# Patient Record
Sex: Female | Born: 1935 | Race: White | Hispanic: No | Marital: Single | Smoking: Never smoker
Health system: Southern US, Community
[De-identification: ages and names within clinical notes are randomized; demographics above are authoritative.]

## PROBLEM LIST (undated history)

## (undated) DIAGNOSIS — F0390 Unspecified dementia without behavioral disturbance: Secondary | ICD-10-CM

## (undated) DIAGNOSIS — F329 Major depressive disorder, single episode, unspecified: Secondary | ICD-10-CM

## (undated) DIAGNOSIS — I4891 Unspecified atrial fibrillation: Secondary | ICD-10-CM

## (undated) DIAGNOSIS — E119 Type 2 diabetes mellitus without complications: Secondary | ICD-10-CM

## (undated) DIAGNOSIS — K219 Gastro-esophageal reflux disease without esophagitis: Secondary | ICD-10-CM

## (undated) DIAGNOSIS — E785 Hyperlipidemia, unspecified: Secondary | ICD-10-CM

## (undated) DIAGNOSIS — F32A Depression, unspecified: Secondary | ICD-10-CM

---

## 2016-04-30 ENCOUNTER — Encounter: Payer: Self-pay | Admitting: Orthopaedic Surgery

## 2016-04-30 ENCOUNTER — Ambulatory Visit (INDEPENDENT_AMBULATORY_CARE_PROVIDER_SITE_OTHER): Payer: Medicare Other | Admitting: Orthopaedic Surgery

## 2016-04-30 VITALS — BP 114/76 | HR 82 | Temp 97.0°F

## 2016-04-30 DIAGNOSIS — R4181 Age-related cognitive decline: Secondary | ICD-10-CM | POA: Diagnosis not present

## 2016-04-30 DIAGNOSIS — M25561 Pain in right knee: Secondary | ICD-10-CM | POA: Diagnosis not present

## 2016-04-30 NOTE — Progress Notes (Signed)
Subjective:  Her right knee hurts.  Her daughter gave history as patient has senility.    Patient ID: Maureen Coleman, female    DOB: 07/06/1934, 82 y.o.   MRN: [id number]  Knee Pain  There was no injury mechanism. The pain is present in the right knee. The quality of the pain is described as aching. The pain is at a severity of 3/10. The pain is mild. The pain has been fluctuating since onset. Associated symptoms include a loss of motion. Pertinent negatives include no loss of sensation, muscle weakness, numbness or tingling. The symptoms are aggravated by weight bearing. She has tried ice, non-weight bearing and rest for the symptoms. The treatment provided mild relief.   The patient is a resident at [facility].  She is accompanied by her daughter who gives the history as the patient has senility.    The patient has a long history of right knee pain.  She has gotten worse over the last few months.  She has intermittent swelling of the knee, giving way and pain.  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  She has had ice and Aspercreme in the nursing home as well as Voltaren Gel with some help.  She is very allergic to prednisone and cannot take any in any form.  She has no trauma but she could have hit the knee.  Her daughter would like to get an injection in the knee for her mother but I told her the injection would have prednisone and I cannot do that.  She agrees.  I will order PT and ice and the Aspercreme for the patient.  She will be treated conservatively.  The PT should help.  I will see her back in two weeks and re-assess.   Review of Systems  HENT: Negative for congestion.   Respiratory: Negative for cough and shortness of breath.   Cardiovascular: Negative for chest pain and leg swelling.  Endocrine: Positive for cold intolerance.  Musculoskeletal: Positive for joint swelling, arthralgias and gait problem.   Allergic/Immunologic: Positive for environmental allergies.  Neurological: Negative for tingling and numbness.  Psychiatric/Behavioral: The patient is nervous/anxious.        Objective:   Physical Exam  Constitutional: She is oriented to person, place, and time. She appears well-developed and well-nourished.  HENT:  Head: Normocephalic and atraumatic.  Eyes: Conjunctivae and EOM are normal. Pupils are equal, round, and reactive to light.  Neck: Normal range of motion. Neck supple.  Cardiovascular: Normal rate, regular rhythm and intact distal pulses.   Pulmonary/Chest: Effort normal.  Abdominal: Soft.  Musculoskeletal: She exhibits tenderness (Right knee with boggy synovium, minimal effusion, ROM 0 to 90 with pain, crepitus, NV intact..  Knee stable.  Left knee with anterior scar from total knee.).  Neurological: She is alert and oriented to person, place, and time. She displays normal reflexes. No cranial nerve deficit. She exhibits normal muscle tone. Coordination normal.  Skin: Skin is warm and dry.  Psychiatric: She has a normal mood and affect. Her behavior is normal.  Patient is confused as to day, time and place.  She is very pleasant but confused.   The left knee has full motion and no pain. The patient is confined to a wheelchair and I do not know how much she walks.  I did review x-ray report and notes from the nursing home.  I completed orders for PT  and a note for the nursing home.      Assessment & Plan:   Encounter Diagnoses  Name Primary?  . Right knee pain Yes  . Senility    I will see her back in two weeks.  Call if any problem.  Electronically Signed Darreld Mclean, MD 6/7/20177:51 PM

## 2016-05-14 ENCOUNTER — Ambulatory Visit: Payer: Medicare Other | Admitting: Orthopaedic Surgery

## 2016-06-11 ENCOUNTER — Ambulatory Visit (INDEPENDENT_AMBULATORY_CARE_PROVIDER_SITE_OTHER): Payer: Medicare Other | Admitting: Orthopedic Surgery

## 2016-06-11 VITALS — BP 125/76 | HR 91 | Ht 62.0 in | Wt 159.4 lb

## 2016-06-11 DIAGNOSIS — R4181 Age-related cognitive decline: Secondary | ICD-10-CM | POA: Diagnosis not present

## 2016-06-11 DIAGNOSIS — Z96652 Presence of left artificial knee joint: Secondary | ICD-10-CM

## 2016-06-11 DIAGNOSIS — M25561 Pain in right knee: Secondary | ICD-10-CM

## 2016-06-11 DIAGNOSIS — M1711 Unilateral primary osteoarthritis, right knee: Secondary | ICD-10-CM

## 2016-06-11 MED ORDER — DICLOFENAC SODIUM 1 % TD GEL: 4.0000 g | Freq: Two times a day (BID) | TRANSDERMAL | Status: DC

## 2016-06-11 NOTE — Progress Notes (Signed)
Patient ID: Maureen Coleman, female   DOB: 11/26/1933, 82 y.o.   MRN: [id number]  Chief Complaint  Patient presents with  . Follow-up    Right knee    HPI 82 year old female previous left total knee done elsewhere presents for evaluation and treatment of ongoing pain and problems with her right knee she's here with her daughter she has Alzheimer's disease  The daughter tells me that she responded well to Aspercreme but the nursing home will not apply it because it's over-the-counter medicine. She's also responded well to Voltaren gel and since it's a prescription the are more likely and have replied that with in order. She cannot get ice her therapy without an order as well.  She was per cc by Dr. Hilda LiasKeeling and his notes indicate that she is allergic to prednisone and steroid injections which her daughter confirms.  Medical history hypertension hyperlipidemia and dementia anemia  ROS Currently no fever chills nausea vomiting.  BP 125/76 mmHg  Pulse 91  Ht 5\' 2"  (1.575 m)  Wt 159 lb 6.4 oz (72.303 kg)  BMI 29.15 kg/m2  Physical Exam Physical Exam  Constitutional: The patient appears well-developed and well-nourished. No distress.  The patient is oriented to person, but not place or time. Psychiatric: The patient has a normal mood and affect.  Cardiovascular: Intact distal pulses.   right and left leg  Neurological: sensation is normal right and left foot Skin: Skin is warm and dry. No rash noted. The patient is not diaphoretic. No erythema. No pallor.    Ortho Exam  Left TKA incision is clean dry and intact flexion is 110 full extension is obtained and collateral ligaments are stable sagittal plane stress test normal.  Right hypertrophic osteoarthritis with a large bony protuberances of the right knee with no effusion  Knee flexion limited to 100 extension 10 flexion contracture quadriceps strength is normal knee is stable in all planes  Neurovascular exam is intact  distally.  ASSESSMENT AND PLAN  Outside xray report : Severe OA   Plan  Ice PT voltaren gel   Return prn  Maureen CanadaStanley Danh Bayus, MD 06/11/2016 4:15 PM

## 2017-01-20 ENCOUNTER — Other Ambulatory Visit (HOSPITAL_COMMUNITY): Payer: Self-pay | Admitting: Internal Medicine

## 2017-01-20 DIAGNOSIS — F22 Delusional disorders: Secondary | ICD-10-CM

## 2017-01-20 DIAGNOSIS — R410 Disorientation, unspecified: Secondary | ICD-10-CM

## 2017-01-30 ENCOUNTER — Ambulatory Visit (HOSPITAL_COMMUNITY): Payer: Medicare Other

## 2017-02-05 ENCOUNTER — Ambulatory Visit (HOSPITAL_COMMUNITY)
Admission: RE | Admit: 2017-02-05 | Discharge: 2017-02-05 | Disposition: A | Discharge status: Home / Self Care | Payer: Medicare Other | Source: Ambulatory Visit | Attending: Internal Medicine | Admitting: Internal Medicine

## 2017-02-05 DIAGNOSIS — F22 Delusional disorders: Secondary | ICD-10-CM

## 2017-02-05 DIAGNOSIS — R9082 White matter disease, unspecified: Secondary | ICD-10-CM | POA: Diagnosis not present

## 2017-02-05 DIAGNOSIS — G311 Senile degeneration of brain, not elsewhere classified: Secondary | ICD-10-CM | POA: Insufficient documentation

## 2017-02-05 DIAGNOSIS — R410 Disorientation, unspecified: Secondary | ICD-10-CM

## 2017-09-05 ENCOUNTER — Emergency Department (HOSPITAL_COMMUNITY): Payer: Medicare Other

## 2017-09-05 ENCOUNTER — Encounter (HOSPITAL_COMMUNITY): Payer: Self-pay | Admitting: Emergency Medicine

## 2017-09-05 ENCOUNTER — Emergency Department (HOSPITAL_COMMUNITY)
Admission: EM | Admit: 2017-09-05 | Discharge: 2017-09-05 | Disposition: A | Discharge status: Home / Self Care | Payer: Medicare Other | Attending: Emergency Medicine | Admitting: Emergency Medicine

## 2017-09-05 DIAGNOSIS — F0390 Unspecified dementia without behavioral disturbance: Secondary | ICD-10-CM | POA: Diagnosis not present

## 2017-09-05 DIAGNOSIS — W19XXXA Unspecified fall, initial encounter: Secondary | ICD-10-CM | POA: Diagnosis not present

## 2017-09-05 DIAGNOSIS — M25562 Pain in left knee: Secondary | ICD-10-CM | POA: Insufficient documentation

## 2017-09-05 DIAGNOSIS — S0083XA Contusion of other part of head, initial encounter: Secondary | ICD-10-CM | POA: Diagnosis not present

## 2017-09-05 DIAGNOSIS — Y9389 Activity, other specified: Secondary | ICD-10-CM | POA: Diagnosis not present

## 2017-09-05 DIAGNOSIS — Y92121 Bathroom in nursing home as the place of occurrence of the external cause: Secondary | ICD-10-CM | POA: Insufficient documentation

## 2017-09-05 DIAGNOSIS — M25561 Pain in right knee: Secondary | ICD-10-CM | POA: Diagnosis not present

## 2017-09-05 DIAGNOSIS — M79602 Pain in left arm: Secondary | ICD-10-CM | POA: Insufficient documentation

## 2017-09-05 DIAGNOSIS — Z79899 Other long term (current) drug therapy: Secondary | ICD-10-CM | POA: Diagnosis not present

## 2017-09-05 DIAGNOSIS — R52 Pain, unspecified: Secondary | ICD-10-CM

## 2017-09-05 DIAGNOSIS — Y998 Other external cause status: Secondary | ICD-10-CM | POA: Insufficient documentation

## 2017-09-05 DIAGNOSIS — S0990XA Unspecified injury of head, initial encounter: Secondary | ICD-10-CM | POA: Diagnosis present

## 2017-09-05 HISTORY — DX: Major depressive disorder, single episode, unspecified: F32.9

## 2017-09-05 HISTORY — DX: Depression, unspecified: F32.A

## 2017-09-05 HISTORY — DX: Hyperlipidemia, unspecified: E78.5

## 2017-09-05 HISTORY — DX: Unspecified dementia, unspecified severity, without behavioral disturbance, psychotic disturbance, mood disturbance, and anxiety: F03.90

## 2017-09-05 NOTE — ED Notes (Signed)
Awaiting EMS transport.  Pt sleeping at this time.  Ate about 25% of breakfast.

## 2017-09-05 NOTE — ED Notes (Signed)
Called [facility] for transport back to [place]. 

## 2017-09-05 NOTE — ED Notes (Signed)
Pt ambulated without difficulty.  States her right knee is hurting some, but was able to walk.  MD aware.

## 2017-09-05 NOTE — Discharge Instructions (Signed)
Imaging is negative for significant traumatic injury. Follow up with your doctor. Return to the ED if you develop new or worsening symptoms.

## 2017-09-05 NOTE — ED Triage Notes (Signed)
Pt found in the floor in the bathroom she is c/o of pain in her left arm and face

## 2017-09-05 NOTE — ED Notes (Signed)
PT given breakfast tray  

## 2017-09-05 NOTE — ED Provider Notes (Signed)
[facility] DEPT Provider Note   CSN: [id number] Arrival date & time: 09/05/17  0440     History   Chief Complaint Chief Complaint  Patient presents with  . Fall    HPI Maureen Coleman is a 83 y.o. female.  Level 5 caveat for dementia. Patient from nursing home after being found on the floor in the bathroom. She apparently fell. This was not witnessed. Patient does not remember falling. Complains of pain "all over". She has swelling to her left side of her face and appears to have bitten her lip. She also complains of left arm pain and bilateral knee pain. Denies any neck or back pain. Denies any chest pain or abdominal pain. She does not take any blood thinners.   The history is provided by the patient and the EMS personnel.  Fall     Past Medical History:  Diagnosis Date  . Dementia   . Depression   . Hyperlipidemia     There are no active problems to display for this patient.   No past surgical history on file.  OB History    No data available       Home Medications    Prior to Admission medications   Medication Sig Start Date End Date Taking? Authorizing Provider  atorvastatin (LIPITOR) 20 MG tablet  06/06/16   [provider]  diclofenac sodium (VOLTAREN) 1 % GEL Apply 4 g topically 2 (two) times daily. 06/11/16   Vickki Hearing, MD  diltiazem (CARDIZEM CD) 180 MG 24 hr capsule  06/04/16   [provider]  donepezil (ARICEPT) 10 MG tablet  05/29/16   [provider]  escitalopram (LEXAPRO) 10 MG tablet  05/22/16   [provider]  folic acid (FOLVITE) 800 MCG tablet Take 400 mcg by mouth daily.    [provider]  glucosamine-chondroitin 500-400 MG tablet Take 1 tablet by mouth 3 (three) times daily.    [provider]  Melatonin 3 MG TABS Take by mouth.    [provider]  naproxen (NAPROSYN) 500 MG tablet  04/16/16   [provider]  omeprazole (PRILOSEC) 20 MG capsule Take 20 mg by  mouth daily.    [provider]  potassium chloride (K-DUR,KLOR-CON) 10 MEQ tablet  05/22/16   [provider]    Family History No family history on file.  Social History Social History  Substance Use Topics  . Smoking status: Not on file  . Smokeless tobacco: Not on file  . Alcohol use Not on file     Allergies   Adhesive [tape]; Augmentin [amoxicillin-pot clavulanate]; Cortisone; Feldene [piroxicam]; Latex; Lotensin [benazepril hcl]; Other; Ultracet [tramadol-acetaminophen]; and Vicodin [hydrocodone-acetaminophen]   Review of Systems Review of Systems  Unable to perform ROS: Dementia     Physical Exam Updated Vital Signs BP (!) 161/83 (BP Location: Right Arm)   Pulse 62   Temp (!) 97.5 F (36.4 C) (Oral)   Resp 10   Ht  (1.626 m)   Wt 72.1 kg (159 lb)   SpO2 96%   BMI 27.29 kg/m   Physical Exam  Constitutional: She appears well-developed and well-nourished. No distress.  HENT:  Head: Normocephalic and atraumatic.  Mouth/Throat: Oropharynx is clear and moist. No oropharyngeal exudate.  Swelling to L cheek and jaw.  Multiple missing teeth, appear chronic Abrasion to L buccal mucosa  Eyes: Pupils are equal, round, and reactive to light. Conjunctivae and EOM are normal.  Neck: Normal range of  motion. Neck supple.  No C spine tenderness  Cardiovascular: Normal rate, regular rhythm, normal heart sounds and intact distal pulses.   No murmur heard. Pulmonary/Chest: Effort normal and breath sounds normal. No respiratory distress.  Abdominal: Soft. There is no tenderness. There is no rebound and no guarding.  Musculoskeletal: Normal range of motion. She exhibits tenderness. She exhibits no edema.  TTP L shoulder and elbow.  ROM intact. Intact radial pulses  FROM hips and knees.  Neurological: She is alert. No cranial nerve deficit. She exhibits normal muscle tone. Coordination normal.   5/5 strength throughout. CN 2-12 intact.Equal grip  strength.  Oriented to person and place.  Skin: Skin is warm. Capillary refill takes less than 2 seconds.  Psychiatric: She has a normal mood and affect. Her behavior is normal.  Nursing note and vitals reviewed.    ED Treatments / Results  Labs (all labs ordered are listed, but only abnormal results are displayed) Labs Reviewed - No data to display  EKG  EKG Interpretation None       Radiology Dg Chest 1 View  Result Date: 09/05/2017 CLINICAL DATA:  Initial evaluation for acute trauma, fall. EXAM: CHEST 1 VIEW COMPARISON:  None. FINDINGS: Cardiac and mediastinal silhouettes are within normal limits. Lungs are hypoinflated. Linear opacities within the right perihilar region and left lung base most consistent with atelectasis and/ or scarring. No focal infiltrates. No pulmonary edema or pleural effusion. No pneumothorax. No acute osseous abnormality. IMPRESSION: 1. Shallow lung inflation with mild bibasilar atelectasis and/or scarring. 2. No other active cardiopulmonary disease. Electronically Signed   By: Rise Mu M.D.   On: 09/05/2017 06:12   Dg Pelvis 1-2 Views  Result Date: 09/05/2017 CLINICAL DATA:  Initial evaluation for acute trauma, fall. EXAM: PELVIS - 1-2 VIEW COMPARISON:  None. FINDINGS: No acute fracture dislocation. Femoral heads in normal alignment within the acetabula. Bony pelvis intact. No pubic diastasis. SI joints approximated. Osteopenia. No soft tissue abnormality. Suture material overlies the left hemipelvis. IMPRESSION: No acute osseous abnormality about the pelvis. Electronically Signed   By: Rise Mu M.D.   On: 09/05/2017 06:10   Dg Elbow Complete Left  Result Date: 09/05/2017 CLINICAL DATA:  Initial evaluation for acute trauma, fall. EXAM: LEFT ELBOW - COMPLETE 3+ VIEW COMPARISON:  None. FINDINGS: No acute fracture or dislocation. No appreciable joint effusion. Radial head intact. Mild degenerative spurring present at the olecranon.  No appreciable soft tissue injury. Bones are osteopenic. IMPRESSION: No acute osseous abnormality about the left elbow. Electronically Signed   By: Rise Mu M.D.   On: 09/05/2017 06:14   Dg Knee 2 Views Left  Result Date: 09/05/2017 CLINICAL DATA:  Initial evaluation for acute trauma, fall. EXAM: LEFT KNEE - 1-2 VIEW COMPARISON:  None. FINDINGS: Left total knee arthroplasty in place. The femoral and tibial components appear well seated. No periprosthetic lucency to suggest loosening or failure. No acute fracture or dislocation. No joint effusion. No acute soft tissue abnormality. Diffuse osteopenia noted. IMPRESSION: 1. No acute fracture or dislocation. 2. Left total knee arthroplasty in place without complication. Electronically Signed   By: Rise Mu M.D.   On: 09/05/2017 06:15   Dg Knee 2 Views Right  Result Date: 09/05/2017 CLINICAL DATA:  Initial evaluation for acute trauma, fall. EXAM: RIGHT KNEE - 1-2 VIEW COMPARISON:  None. FINDINGS: No acute fracture or dislocation. No joint effusion. Severe tricompartmental degenerative osteoarthrosis. No soft tissue abnormality. Diffuse osteopenia. IMPRESSION: 1. No acute fracture or dislocation.  2. Severe tricompartmental degenerative osteoarthrosis. Electronically Signed   By: Rise Mu M.D.   On: 09/05/2017 06:16   Ct Head Wo Contrast  Result Date: 09/05/2017 CLINICAL DATA:  Found on floor, with left-sided facial bruising and swelling. Concern for head or cervical spine injury. Syncope. Initial encounter. EXAM: CT HEAD WITHOUT CONTRAST CT MAXILLOFACIAL WITHOUT CONTRAST CT CERVICAL SPINE WITHOUT CONTRAST TECHNIQUE: Multidetector CT imaging of the head, cervical spine, and maxillofacial structures were performed using the standard protocol without intravenous contrast. Multiplanar CT image reconstructions of the cervical spine and maxillofacial structures were also generated. COMPARISON:  CT of the head performed  02/05/2017 FINDINGS: CT HEAD FINDINGS Brain: No evidence of acute infarction, hemorrhage, hydrocephalus, extra-axial collection or mass lesion/mass effect. Prominence of the ventricles and sulci reflects mild cortical volume loss. Mild periventricular and subcortical white matter change likely reflects small vessel ischemic microangiopathy. The brainstem and fourth ventricle are within normal limits. The basal ganglia are unremarkable in appearance. The cerebral hemispheres demonstrate grossly normal gray-white differentiation. No mass effect or midline shift is seen. Vascular: No hyperdense vessel or unexpected calcification. Skull: There is no evidence of fracture; visualized osseous structures are unremarkable in appearance. Other: Soft tissue swelling is noted overlying the left zygomaticomaxillary complex. CT MAXILLOFACIAL FINDINGS Osseous: There is no evidence of fracture or dislocation. The maxilla and mandible appear intact. The nasal bone is unremarkable in appearance. The visualized dentition demonstrates no acute abnormality. Mild degenerative change is noted at the left temporomandibular joint. Orbits: The orbits are intact bilaterally. Sinuses: There is partial opacification of the right mastoid air cells. The visualized paranasal sinuses and left mastoid air cells are well-aerated. Soft tissues: Soft tissue swelling is noted overlying the left maxilla and left zygomaticomaxillary complex. The parapharyngeal fat planes are preserved. The nasopharynx, oropharynx and hypopharynx are unremarkable in appearance. The visualized portions of the valleculae and piriform sinuses are grossly unremarkable. The parotid and submandibular glands are within normal limits. No cervical lymphadenopathy is seen. CT CERVICAL SPINE FINDINGS Alignment: Normal. Skull base and vertebrae: No acute fracture. No primary bone lesion or focal pathologic process. There is incomplete fusion of the posterior arch of C1. Soft tissues  and spinal canal: No prevertebral fluid or swelling. No visible canal hematoma. Disc levels: Mild intervertebral disc space narrowing is noted at C6-C7. Scattered anterior and posterior disc osteophyte complexes are noted along the lower cervical spine, with underlying facet disease. Upper chest: The thyroid gland is unremarkable. The visualized lung apices appear grossly clear. Other: No additional soft tissue abnormalities are seen. IMPRESSION: 1. No evidence of traumatic intracranial injury or fracture. 2. No evidence of fracture or subluxation along the cervical spine. 3. No evidence of fracture or dislocation with regard to the maxillofacial structures. 4. Soft tissue swelling overlying the left zygomaticomaxillary complex and left maxilla. 5. Mild cortical volume loss and scattered small vessel ischemic microangiopathy. 6. Mild degenerative change at the left temporomandibular joint. 7. Partial opacification of the right mastoid air cells. 8. Mild degenerative change along the lower cervical spine. Electronically Signed   By: Roanna Raider M.D.   On: 09/05/2017 05:46   Ct Cervical Spine Wo Contrast  Result Date: 09/05/2017 CLINICAL DATA:  Found on floor, with left-sided facial bruising and swelling. Concern for head or cervical spine injury. Syncope. Initial encounter. EXAM: CT HEAD WITHOUT CONTRAST CT MAXILLOFACIAL WITHOUT CONTRAST CT CERVICAL SPINE WITHOUT CONTRAST TECHNIQUE: Multidetector CT imaging of the head, cervical spine, and maxillofacial structures were performed using the standard  protocol without intravenous contrast. Multiplanar CT image reconstructions of the cervical spine and maxillofacial structures were also generated. COMPARISON:  CT of the head performed 02/05/2017 FINDINGS: CT HEAD FINDINGS Brain: No evidence of acute infarction, hemorrhage, hydrocephalus, extra-axial collection or mass lesion/mass effect. Prominence of the ventricles and sulci reflects mild cortical volume loss.  Mild periventricular and subcortical white matter change likely reflects small vessel ischemic microangiopathy. The brainstem and fourth ventricle are within normal limits. The basal ganglia are unremarkable in appearance. The cerebral hemispheres demonstrate grossly normal gray-white differentiation. No mass effect or midline shift is seen. Vascular: No hyperdense vessel or unexpected calcification. Skull: There is no evidence of fracture; visualized osseous structures are unremarkable in appearance. Other: Soft tissue swelling is noted overlying the left zygomaticomaxillary complex. CT MAXILLOFACIAL FINDINGS Osseous: There is no evidence of fracture or dislocation. The maxilla and mandible appear intact. The nasal bone is unremarkable in appearance. The visualized dentition demonstrates no acute abnormality. Mild degenerative change is noted at the left temporomandibular joint. Orbits: The orbits are intact bilaterally. Sinuses: There is partial opacification of the right mastoid air cells. The visualized paranasal sinuses and left mastoid air cells are well-aerated. Soft tissues: Soft tissue swelling is noted overlying the left maxilla and left zygomaticomaxillary complex. The parapharyngeal fat planes are preserved. The nasopharynx, oropharynx and hypopharynx are unremarkable in appearance. The visualized portions of the valleculae and piriform sinuses are grossly unremarkable. The parotid and submandibular glands are within normal limits. No cervical lymphadenopathy is seen. CT CERVICAL SPINE FINDINGS Alignment: Normal. Skull base and vertebrae: No acute fracture. No primary bone lesion or focal pathologic process. There is incomplete fusion of the posterior arch of C1. Soft tissues and spinal canal: No prevertebral fluid or swelling. No visible canal hematoma. Disc levels: Mild intervertebral disc space narrowing is noted at C6-C7. Scattered anterior and posterior disc osteophyte complexes are noted along the  lower cervical spine, with underlying facet disease. Upper chest: The thyroid gland is unremarkable. The visualized lung apices appear grossly clear. Other: No additional soft tissue abnormalities are seen. IMPRESSION: 1. No evidence of traumatic intracranial injury or fracture. 2. No evidence of fracture or subluxation along the cervical spine. 3. No evidence of fracture or dislocation with regard to the maxillofacial structures. 4. Soft tissue swelling overlying the left zygomaticomaxillary complex and left maxilla. 5. Mild cortical volume loss and scattered small vessel ischemic microangiopathy. 6. Mild degenerative change at the left temporomandibular joint. 7. Partial opacification of the right mastoid air cells. 8. Mild degenerative change along the lower cervical spine. Electronically Signed   By: Roanna Raider M.D.   On: 09/05/2017 05:46   Dg Shoulder Left  Result Date: 09/05/2017 CLINICAL DATA:  Initial evaluation for acute trauma, fall. EXAM: LEFT SHOULDER - 2+ VIEW COMPARISON:  None. FINDINGS: No acute fracture or dislocation. AC joint approximated. Humeral head mildly high-riding relative to the glenoid, suggesting underlying rotator cuff pathology. Osteoarthritic changes present about the [facility] and glenohumeral joints. No acute soft tissue abnormality. Visualized left hemithorax is clear. Osteopenia. IMPRESSION: No acute osseous abnormality about the left shoulder. Electronically Signed   By: Rise Mu M.D.   On: 09/05/2017 06:18   Ct Maxillofacial Wo Contrast  Result Date: 09/05/2017 CLINICAL DATA:  Found on floor, with left-sided facial bruising and swelling. Concern for head or cervical spine injury. Syncope. Initial encounter. EXAM: CT HEAD WITHOUT CONTRAST CT MAXILLOFACIAL WITHOUT CONTRAST CT CERVICAL SPINE WITHOUT CONTRAST TECHNIQUE: Multidetector CT imaging of the head, cervical  spine, and maxillofacial structures were performed using the standard protocol without intravenous  contrast. Multiplanar CT image reconstructions of the cervical spine and maxillofacial structures were also generated. COMPARISON:  CT of the head performed 02/05/2017 FINDINGS: CT HEAD FINDINGS Brain: No evidence of acute infarction, hemorrhage, hydrocephalus, extra-axial collection or mass lesion/mass effect. Prominence of the ventricles and sulci reflects mild cortical volume loss. Mild periventricular and subcortical white matter change likely reflects small vessel ischemic microangiopathy. The brainstem and fourth ventricle are within normal limits. The basal ganglia are unremarkable in appearance. The cerebral hemispheres demonstrate grossly normal gray-white differentiation. No mass effect or midline shift is seen. Vascular: No hyperdense vessel or unexpected calcification. Skull: There is no evidence of fracture; visualized osseous structures are unremarkable in appearance. Other: Soft tissue swelling is noted overlying the left zygomaticomaxillary complex. CT MAXILLOFACIAL FINDINGS Osseous: There is no evidence of fracture or dislocation. The maxilla and mandible appear intact. The nasal bone is unremarkable in appearance. The visualized dentition demonstrates no acute abnormality. Mild degenerative change is noted at the left temporomandibular joint. Orbits: The orbits are intact bilaterally. Sinuses: There is partial opacification of the right mastoid air cells. The visualized paranasal sinuses and left mastoid air cells are well-aerated. Soft tissues: Soft tissue swelling is noted overlying the left maxilla and left zygomaticomaxillary complex. The parapharyngeal fat planes are preserved. The nasopharynx, oropharynx and hypopharynx are unremarkable in appearance. The visualized portions of the valleculae and piriform sinuses are grossly unremarkable. The parotid and submandibular glands are within normal limits. No cervical lymphadenopathy is seen. CT CERVICAL SPINE FINDINGS Alignment: Normal. Skull base  and vertebrae: No acute fracture. No primary bone lesion or focal pathologic process. There is incomplete fusion of the posterior arch of C1. Soft tissues and spinal canal: No prevertebral fluid or swelling. No visible canal hematoma. Disc levels: Mild intervertebral disc space narrowing is noted at C6-C7. Scattered anterior and posterior disc osteophyte complexes are noted along the lower cervical spine, with underlying facet disease. Upper chest: The thyroid gland is unremarkable. The visualized lung apices appear grossly clear. Other: No additional soft tissue abnormalities are seen. IMPRESSION: 1. No evidence of traumatic intracranial injury or fracture. 2. No evidence of fracture or subluxation along the cervical spine. 3. No evidence of fracture or dislocation with regard to the maxillofacial structures. 4. Soft tissue swelling overlying the left zygomaticomaxillary complex and left maxilla. 5. Mild cortical volume loss and scattered small vessel ischemic microangiopathy. 6. Mild degenerative change at the left temporomandibular joint. 7. Partial opacification of the right mastoid air cells. 8. Mild degenerative change along the lower cervical spine. Electronically Signed   By: Roanna Raider M.D.   On: 09/05/2017 05:46    Procedures Procedures (including critical care time)  Medications Ordered in ED Medications - No data to display   Initial Impression / Assessment and Plan / ED Course  I have reviewed the triage vital signs and the nursing notes.  Pertinent labs & imaging results that were available during my care of the patient were reviewed by me and considered in my medical decision making (see chart for details).    Unwitnessed fall with facial trauma and left arm pain. No blood thinner use.  Vitals are stable. Patient's no distress. No neurological deficits.  Imaging obtained a head and face given her significant facial swelling.  Traumatic imaging is negative. No facial  fractures. No intracranial injury. X-rays are negative of her knees and arm.  Patient is able to ambulate.  She is tolerating by mouth. She appears stable to return to her facility.  Final Clinical Impressions(s) / ED Diagnoses   Final diagnoses:  Fall, initial encounter  Contusion of face, initial encounter    New Prescriptions New Prescriptions   No medications on file     Glynn Octave, MD 09/05/17 [phone]

## 2017-10-18 ENCOUNTER — Encounter (HOSPITAL_COMMUNITY): Payer: Self-pay | Admitting: *Deleted

## 2017-10-18 ENCOUNTER — Emergency Department (HOSPITAL_COMMUNITY)
Admission: EM | Admit: 2017-10-18 | Discharge: 2017-10-19 | Disposition: A | Discharge status: Home / Self Care | Payer: Medicare Other | Attending: Emergency Medicine | Admitting: Emergency Medicine

## 2017-10-18 ENCOUNTER — Other Ambulatory Visit: Payer: Self-pay

## 2017-10-18 DIAGNOSIS — Y9389 Activity, other specified: Secondary | ICD-10-CM | POA: Insufficient documentation

## 2017-10-18 DIAGNOSIS — W06XXXA Fall from bed, initial encounter: Secondary | ICD-10-CM | POA: Insufficient documentation

## 2017-10-18 DIAGNOSIS — Y999 Unspecified external cause status: Secondary | ICD-10-CM | POA: Insufficient documentation

## 2017-10-18 DIAGNOSIS — R51 Headache: Secondary | ICD-10-CM | POA: Diagnosis not present

## 2017-10-18 DIAGNOSIS — S161XXA Strain of muscle, fascia and tendon at neck level, initial encounter: Secondary | ICD-10-CM | POA: Insufficient documentation

## 2017-10-18 DIAGNOSIS — Z79899 Other long term (current) drug therapy: Secondary | ICD-10-CM | POA: Diagnosis not present

## 2017-10-18 DIAGNOSIS — S0003XA Contusion of scalp, initial encounter: Secondary | ICD-10-CM | POA: Diagnosis not present

## 2017-10-18 DIAGNOSIS — Z91040 Latex allergy status: Secondary | ICD-10-CM | POA: Diagnosis not present

## 2017-10-18 DIAGNOSIS — F0390 Unspecified dementia without behavioral disturbance: Secondary | ICD-10-CM | POA: Diagnosis not present

## 2017-10-18 DIAGNOSIS — Y929 Unspecified place or not applicable: Secondary | ICD-10-CM | POA: Insufficient documentation

## 2017-10-18 DIAGNOSIS — W19XXXA Unspecified fall, initial encounter: Secondary | ICD-10-CM

## 2017-10-18 DIAGNOSIS — S0990XA Unspecified injury of head, initial encounter: Secondary | ICD-10-CM | POA: Diagnosis present

## 2017-10-18 NOTE — ED Triage Notes (Signed)
Pt arrived by EMS from [facility]. Per facility pt fell & hit head in the same spot she hit it the other day. Pt states some dizziness since falling the other day.

## 2017-10-19 ENCOUNTER — Emergency Department (HOSPITAL_COMMUNITY): Payer: Medicare Other

## 2017-10-19 DIAGNOSIS — S0003XA Contusion of scalp, initial encounter: Secondary | ICD-10-CM | POA: Diagnosis not present

## 2017-10-19 NOTE — ED Provider Notes (Addendum)
[facility] EMERGENCY DEPARTMENT Provider Note   CSN: [id number] Arrival date & time: 10/18/17  2342     History   Chief Complaint Chief Complaint  Patient presents with  . Fall    HPI Stanford ScotlandDorothy Steffey is a 83 y.o. female.  Patient is an 83 year old female presenting with complaints of fall.  She has a history of dementia and is a nursing home resident.  This evening she fell off the side of the bed and was reported to have struck her head on the floor.  There is no loss of consciousness.  The patient has no recollection of this, most likely related to her dementia.  Her only complaint is "I do not feel good".  She denies any other complaints.   The history is provided by the patient.  Fall  This is a new problem. The current episode started 1 to 2 hours ago. The problem occurs constantly. The problem has not changed since onset.Nothing aggravates the symptoms. Nothing relieves the symptoms. She has tried nothing for the symptoms.    Past Medical History:  Diagnosis Date  . Dementia   . Depression   . Hyperlipidemia     There are no active problems to display for this patient.   No past surgical history on file.  OB History    No data available       Home Medications    Prior to Admission medications   Medication Sig Start Date End Date Taking? Authorizing Provider  atorvastatin (LIPITOR) 20 MG tablet  06/06/16   [provider]  diclofenac sodium (VOLTAREN) 1 % GEL Apply 4 g topically 2 (two) times daily. 06/11/16   Vickki HearingHarrison, Stanley E, MD  diltiazem (CARDIZEM CD) 180 MG 24 hr capsule  06/04/16   [provider]  donepezil (ARICEPT) 10 MG tablet  05/29/16   [provider]  escitalopram (LEXAPRO) 10 MG tablet  05/22/16   [provider]  folic acid (FOLVITE) 800 MCG tablet Take 400 mcg by mouth daily.    [provider]  glucosamine-chondroitin 500-400 MG tablet Take 1 tablet by mouth 3 (three) times daily.    [provider]  Melatonin 3 MG TABS Take by mouth.    [provider]  naproxen (NAPROSYN) 500 MG tablet  04/16/16   [provider]  omeprazole (PRILOSEC) 20 MG capsule Take 20 mg by mouth daily.    [provider]  potassium chloride (K-DUR,KLOR-CON) 10 MEQ tablet  05/22/16   [provider]    Family History No family history on file.  Social History Social History   Tobacco Use  . Smoking status: Never Smoker  . Smokeless tobacco: Never Used  Substance Use Topics  . Alcohol use: Not on file  . Drug use: Not on file     Allergies   Adhesive [tape]; Augmentin [amoxicillin-pot clavulanate]; Cortisone; Feldene [piroxicam]; Latex; Lotensin [benazepril hcl]; Other; Ultracet [tramadol-acetaminophen]; and Vicodin [hydrocodone-acetaminophen]   Review of Systems Review of Systems   Physical Exam Updated Vital Signs BP 130/65   Pulse (!) 54   Temp 97.9 F (36.6 C) (Oral)   Resp 17   Wt 72.6 kg (160 lb)   SpO2 97%   BMI 27.46 kg/m   Physical Exam  Constitutional: She is oriented to person, place, and time. She appears well-developed and well-nourished. No distress.  HENT:  Head: Normocephalic and atraumatic.  Eyes: EOM are normal. Pupils are equal, round, and reactive to light.  Neck:  Normal range of motion. Neck supple.  There is no bony tenderness of the cervical spine and no step-off.  Cardiovascular: Normal rate and regular rhythm. Exam reveals no gallop and no friction rub.  No murmur heard. Pulmonary/Chest: Effort normal and breath sounds normal. No respiratory distress. She has no wheezes.  Abdominal: Soft. Bowel sounds are normal. She exhibits no distension. There is no tenderness.  Musculoskeletal: Normal range of motion.  Neurological: She is alert and oriented to person, place, and time. No cranial nerve deficit. She exhibits normal muscle tone. Coordination normal.  Skin: Skin is warm and dry. She is not diaphoretic.    Nursing note and vitals reviewed.    ED Treatments / Results  Labs (all labs ordered are listed, but only abnormal results are displayed) Labs Reviewed - No data to display  EKG  EKG Interpretation None       Radiology No results found.  Procedures Procedures (including critical care time)  Medications Ordered in ED Medications - No data to display   Initial Impression / Assessment and Plan / ED Course  I have reviewed the triage vital signs and the nursing notes.  Pertinent labs & imaging results that were available during my care of the patient were reviewed by me and considered in my medical decision making (see chart for details).  Patient sent to the ER after a fall. Head CT and CT of C-Spine are unremarkable. She appears neurologically intact. Will discharge, to return as needed.  Final Clinical Impressions(s) / ED Diagnoses   Final diagnoses:  None    ED Discharge Orders    None       Geoffery Lyonselo, Akhilesh Sassone, MD 10/19/17 [id number]    Geoffery Lyonselo, Carlethia Mesquita, MD 11/05/17 2256

## 2017-10-19 NOTE — ED Notes (Signed)
Pt unable to e sign discharge due to dementia.

## 2017-10-19 NOTE — Discharge Instructions (Signed)
Continue medications as before.  Return to the emergency department for worsening headache, difficulty waking, seizure activity, or other new and concerning symptoms.

## 2017-11-06 ENCOUNTER — Encounter (HOSPITAL_COMMUNITY): Payer: Self-pay

## 2017-11-06 ENCOUNTER — Emergency Department (HOSPITAL_COMMUNITY): Payer: Medicare Other

## 2017-11-06 ENCOUNTER — Emergency Department (HOSPITAL_COMMUNITY)
Admission: EM | Admit: 2017-11-06 | Discharge: 2017-11-06 | Disposition: A | Discharge status: Home / Self Care | Payer: Medicare Other | Attending: Emergency Medicine | Admitting: Emergency Medicine

## 2017-11-06 DIAGNOSIS — Y92129 Unspecified place in nursing home as the place of occurrence of the external cause: Secondary | ICD-10-CM | POA: Insufficient documentation

## 2017-11-06 DIAGNOSIS — F0390 Unspecified dementia without behavioral disturbance: Secondary | ICD-10-CM | POA: Insufficient documentation

## 2017-11-06 DIAGNOSIS — Z79899 Other long term (current) drug therapy: Secondary | ICD-10-CM | POA: Diagnosis not present

## 2017-11-06 DIAGNOSIS — Y939 Activity, unspecified: Secondary | ICD-10-CM | POA: Insufficient documentation

## 2017-11-06 DIAGNOSIS — Y999 Unspecified external cause status: Secondary | ICD-10-CM | POA: Insufficient documentation

## 2017-11-06 DIAGNOSIS — W06XXXA Fall from bed, initial encounter: Secondary | ICD-10-CM | POA: Insufficient documentation

## 2017-11-06 DIAGNOSIS — Z91040 Latex allergy status: Secondary | ICD-10-CM | POA: Diagnosis not present

## 2017-11-06 DIAGNOSIS — W19XXXA Unspecified fall, initial encounter: Secondary | ICD-10-CM

## 2017-11-06 DIAGNOSIS — Z7982 Long term (current) use of aspirin: Secondary | ICD-10-CM | POA: Insufficient documentation

## 2017-11-06 DIAGNOSIS — S22000A Wedge compression fracture of unspecified thoracic vertebra, initial encounter for closed fracture: Secondary | ICD-10-CM | POA: Diagnosis not present

## 2017-11-06 NOTE — Discharge Instructions (Signed)
Your xrays and CT scans are ok except that you have a compression fracture at the lower end of your thoracic spine.  However, this injury appears to be an old injury.  If you are having pain at this site,  you should have further tests.  If this is not bothering you, it is ok to ignore this finding.  Please discuss with your primary doctor as warranted for pain at this site.

## 2017-11-06 NOTE — ED Triage Notes (Signed)
Per ems pt rolled out of bed and fell approx 1 foot.  Pt initially wasn't complaining of pain but on the way to er pt started c/o mid back pain.  Pt has history of dementia.

## 2017-11-06 NOTE — ED Provider Notes (Signed)
[facility]ERGENCY [facility]PART[facility] Provider Note   CSN: [id number] Arrival date & time: 11/06/17  1647     History   Chief Complaint Chief Complaint  Patient presents with  . Fall    HPI Stanford ScotlandDorothy Barkalow is a 83 y.o. female with a history of dementia and is a poor history giver presenting from a local nursing facility for evaluation of fall which patient does not recall.  Per the facility report, she rolled out of bed which is approximately one foot off the floor.  Initially she denies pain, but later started having complaints of mid back pain. When asked currently where she hurts, she responds "my body" but is not more specific.   The history is provided by the nursing home and medical records. The history is limited by the condition of the patient.    Past Medical History:  Diagnosis Date  . Dementia   . Depression   . Hyperlipidemia     There are no active problems to display for this patient.   History reviewed. No pertinent surgical history.  OB History    No data available       Home Medications    Prior to Admission medications   Medication Sig Start Date End Date Taking? Authorizing Provider  aspirin EC 81 MG tablet Take 81 mg by mouth daily.   Yes [provider]  atorvastatin (LIPITOR) 20 MG tablet Take 20 mg by mouth every evening.  06/06/16  Yes [provider]  diltiazem (CARDIZEM CD) 180 MG 24 hr capsule Take 180 mg by mouth daily.  06/04/16  Yes [provider]  donepezil (ARICEPT) 10 MG tablet Take 10 mg by mouth every morning.  05/29/16  Yes [provider]  escitalopram (LEXAPRO) 10 MG tablet Take 10 mg by mouth daily.  05/22/16  Yes [provider]  Glucosamine-Chondroitin 250-200 MG CAPS Take 1 tablet by mouth daily.    Yes [provider]  guaifenesin (HUMIBID E) 400 MG TABS tablet Take 400 mg by mouth every 12 (twelve) hours.   Yes [provider]  LORazepam (ATIVAN) 0.5 MG tablet Take 0.5 mg  by mouth every 12 (twelve) hours.   Yes [provider]  Melatonin 3 MG TABS Take 3 mg by mouth at bedtime as needed (sleep).    Yes [provider]  Multiple Vitamin (MULTIVITAMIN WITH MINERALS) TABS tablet Take 1 tablet by mouth daily.   Yes [provider]  omeprazole (PRILOSEC) 20 MG capsule Take 20 mg by mouth every evening.    Yes [provider]  OXYGEN Inhale 2 L into the lungs daily.   Yes [provider]    Family History No family history on file.  Social History Social History   Tobacco Use  . Smoking status: Never Smoker  . Smokeless tobacco: Never Used  Substance Use Topics  . Alcohol use: Not on file  . Drug use: Not on file     Allergies   Adhesive [tape]; Augmentin [amoxicillin-pot clavulanate]; Cefdinir; Cortisone; Feldene [piroxicam]; Glucosamine forte [nutritional supplements]; Labetalol; Latex; Lotensin [benazepril hcl]; Other; Ultracet [tramadol-acetaminophen]; and Vicodin [hydrocodone-acetaminophen]   Review of Systems Review of Systems  Unable to perform ROS: Dementia  Musculoskeletal: Positive for arthralgias.     Physical Exam Updated Vital Signs BP 102/83 (BP Location: Right Arm)   Pulse 86   Temp 98.1 F (36.7 C) (Oral)   Resp 20   Wt 72.6 kg (160 lb)   SpO2 97%  BMI 27.46 kg/m   Physical Exam  Constitutional: She appears well-developed and well-nourished.  HENT:  Head: Normocephalic and atraumatic.  Eyes: Conjunctivae are normal.  ectropion left lower lid.   Neck: Normal range of motion. Neck supple.  No obvious focal ttp, no deformity or step offs.  Cardiovascular: Normal rate, regular rhythm, normal heart sounds and intact distal pulses.  Pulmonary/Chest: Effort normal and breath sounds normal. She has no wheezes.  Abdominal: Soft. Bowel sounds are normal. There is no tenderness.  Musculoskeletal: Normal range of motion.       Thoracic back: She exhibits bony tenderness. She exhibits  no swelling, no edema and no deformity.  Pt endorses bilateral upper chest pain with bilateral shoulder flexion. No deformity, no flail. Moves ankles, knees and hips without pain.  Neurological: She is alert.  Skin: Skin is warm and dry.  Psychiatric: She has a normal mood and affect.  Nursing note and vitals reviewed.    ED Treatments / Results  Labs (all labs ordered are listed, but only abnormal results are displayed) Labs Reviewed - No data to display  EKG  EKG Interpretation None       Radiology Dg Chest 2 View  Result Date: 11/06/2017 CLINICAL DATA:  Fall EXAM: CHEST  2 VIEW COMPARISON:  09/05/2017 FINDINGS: The heart size and mediastinal contours are within normal limits. Right mid lung and left lower lobe scarring noted. Degenerative changes noted involving both glenohumeral joints. Lower thoracic/upper lumbar spine. The visualized skeletal structures are unremarkable. IMPRESSION: 1. No acute cardiopulmonary abnormalities. 2. Age indeterminate thoracolumbar compression deformity. Electronically Signed   By: Signa Kell M.D.   On: 11/06/2017 18:16   Dg Thoracic Spine 2 View  Result Date: 11/06/2017 CLINICAL DATA:  Dementia patient post fall after rolling out of bed. EXAM: THORACIC SPINE 2 VIEWS COMPARISON:  No prior thoracic spine imaging. AP chest radiograph 09/05/2017 FINDINGS: Twelfth ribs are diminutive or absent. Severe T12 compression fracture with focal kyphosis, possibly present on prior AP chest exam, age indeterminate. Minimal T10 compression fracture with mild loss of height of superior endplate, acuity uncertain. Remaining thoracic vertebral body heights are maintained. No paravertebral soft tissue abnormality to suggest fracture. IMPRESSION: Severe T12 compression fracture, age indeterminate, but may be remote based on prior chest radiograph. Mild T10 compression fracture, age indeterminate. Electronically Signed   By: Rubye Oaks M.D.   On: 11/06/2017 18:21     Dg Lumbar Spine Complete  Result Date: 11/06/2017 CLINICAL DATA:  Dementia patient post fall after rolling out of bed. EXAM: LUMBAR SPINE - COMPLETE 4+ VIEW COMPARISON:  None. FINDINGS: Severe T12 compression fracture with focal kyphosis, age indeterminate. Lumbar vertebral body heights are preserved. Mild levo scoliotic curvature of the lumbar spine. Advanced disc space narrowing at L3-L4 with endplate spurring. Multilevel facet arthropathy. Bones are under mineralized. Enteric sutures noted in the left abdomen. IMPRESSION: 1. Age indeterminate T12 compression fracture. No additional acute fracture of the lumbar spine. 2. Scoliosis and multilevel degenerative change. Electronically Signed   By: Rubye Oaks M.D.   On: 11/06/2017 18:22   Ct Head Wo Contrast  Result Date: 11/06/2017 CLINICAL DATA:  Fall after rolling out of bed. EXAM: CT HEAD WITHOUT CONTRAST CT CERVICAL SPINE WITHOUT CONTRAST TECHNIQUE: Multidetector CT imaging of the head and cervical spine was performed following the standard protocol without intravenous contrast. Multiplanar CT image reconstructions of the cervical spine were also generated. COMPARISON:  10/19/2017 FINDINGS: CT HEAD FINDINGS Brain: There is prominence of  the sulci and ventricles compatible with brain atrophy. There is mild diffuse low-attenuation within the subcortical and periventricular white matter compatible with chronic microvascular disease. Vascular: No hyperdense vessel or unexpected calcification. Skull: Normal. Negative for fracture or focal lesion. Sinuses/Orbits: No acute finding. Other: None. CT CERVICAL SPINE FINDINGS Alignment: Normal. Skull base and vertebrae: No acute fracture. No primary bone lesion or focal pathologic process. Soft tissues and spinal canal: No prevertebral fluid or swelling. No visible canal hematoma. Disc levels: There is disc space narrowing and ventral endplate spurring at C5-6 and C6-7. The facet joints appear well-aligned.  No fractures or subluxations. Upper chest: Negative. Other: None IMPRESSION: 1. Chronic small vessel ischemic change and brain atrophy identified. 2. No acute intracranial abnormality. 3. No evidence for cervical spine fracture. 4. Cervical degenerative disc disease. Electronically Signed   By: Signa Kellaylor  Stroud M.D.   On: 11/06/2017 18:41   Ct Cervical Spine Wo Contrast  Result Date: 11/06/2017 CLINICAL DATA:  Fall after rolling out of bed. EXAM: CT HEAD WITHOUT CONTRAST CT CERVICAL SPINE WITHOUT CONTRAST TECHNIQUE: Multidetector CT imaging of the head and cervical spine was performed following the standard protocol without intravenous contrast. Multiplanar CT image reconstructions of the cervical spine were also generated. COMPARISON:  10/19/2017 FINDINGS: CT HEAD FINDINGS Brain: There is prominence of the sulci and ventricles compatible with brain atrophy. There is mild diffuse low-attenuation within the subcortical and periventricular white matter compatible with chronic microvascular disease. Vascular: No hyperdense vessel or unexpected calcification. Skull: Normal. Negative for fracture or focal lesion. Sinuses/Orbits: No acute finding. Other: None. CT CERVICAL SPINE FINDINGS Alignment: Normal. Skull base and vertebrae: No acute fracture. No primary bone lesion or focal pathologic process. Soft tissues and spinal canal: No prevertebral fluid or swelling. No visible canal hematoma. Disc levels: There is disc space narrowing and ventral endplate spurring at C5-6 and C6-7. The facet joints appear well-aligned. No fractures or subluxations. Upper chest: Negative. Other: None IMPRESSION: 1. Chronic small vessel ischemic change and brain atrophy identified. 2. No acute intracranial abnormality. 3. No evidence for cervical spine fracture. 4. Cervical degenerative disc disease. Electronically Signed   By: Signa Kellaylor  Stroud M.D.   On: 11/06/2017 18:41    Procedures Procedures (including critical care  time)  Medications Ordered in ED Medications - No data to display   Initial Impression / Assessment and Plan / ED Course  I have reviewed the triage vital signs and the nursing notes.  Pertinent labs & imaging results that were available during my care of the patient were reviewed by me and considered in my medical decision making (see chart for details).     Imaging reviewed.  Thoracic vertebral fractures reviewed and patient reexamined she is non-tender at these locations, favoring subacute injuries.  She was however advised to follow-up care by her PCP for any new back pain symptoms.  Final Clinical Impressions(s) / ED Diagnoses   Final diagnoses:  Fall, initial encounter  Closed compression fracture of thoracic vertebra, initial encounter [facility](HCC)    ED Discharge Orders    None       Victoriano Laindol, Sidonie Dexheimer, PA-C 11/06/17 2317    Samuel JesterMcManus, Kathleen, DO 11/09/17 1755

## 2017-11-14 ENCOUNTER — Other Ambulatory Visit: Payer: Self-pay

## 2017-11-14 ENCOUNTER — Encounter (HOSPITAL_COMMUNITY): Payer: Self-pay

## 2017-11-14 ENCOUNTER — Emergency Department (HOSPITAL_COMMUNITY)
Admission: EM | Admit: 2017-11-14 | Discharge: 2017-11-14 | Disposition: A | Discharge status: Home / Self Care | Payer: Medicare Other | Attending: Emergency Medicine | Admitting: Emergency Medicine

## 2017-11-14 ENCOUNTER — Emergency Department (HOSPITAL_COMMUNITY): Payer: Medicare Other

## 2017-11-14 DIAGNOSIS — H5789 Other specified disorders of eye and adnexa: Secondary | ICD-10-CM | POA: Diagnosis not present

## 2017-11-14 DIAGNOSIS — Y999 Unspecified external cause status: Secondary | ICD-10-CM | POA: Insufficient documentation

## 2017-11-14 DIAGNOSIS — Z79899 Other long term (current) drug therapy: Secondary | ICD-10-CM | POA: Diagnosis not present

## 2017-11-14 DIAGNOSIS — T07XXXA Unspecified multiple injuries, initial encounter: Secondary | ICD-10-CM | POA: Diagnosis not present

## 2017-11-14 DIAGNOSIS — Z880 Allergy status to penicillin: Secondary | ICD-10-CM | POA: Diagnosis not present

## 2017-11-14 DIAGNOSIS — R42 Dizziness and giddiness: Secondary | ICD-10-CM | POA: Insufficient documentation

## 2017-11-14 DIAGNOSIS — M25569 Pain in unspecified knee: Secondary | ICD-10-CM | POA: Insufficient documentation

## 2017-11-14 DIAGNOSIS — Y939 Activity, unspecified: Secondary | ICD-10-CM | POA: Diagnosis not present

## 2017-11-14 DIAGNOSIS — W19XXXA Unspecified fall, initial encounter: Secondary | ICD-10-CM | POA: Diagnosis not present

## 2017-11-14 DIAGNOSIS — Y92129 Unspecified place in nursing home as the place of occurrence of the external cause: Secondary | ICD-10-CM | POA: Diagnosis not present

## 2017-11-14 DIAGNOSIS — Z91040 Latex allergy status: Secondary | ICD-10-CM | POA: Insufficient documentation

## 2017-11-14 DIAGNOSIS — F0390 Unspecified dementia without behavioral disturbance: Secondary | ICD-10-CM | POA: Diagnosis not present

## 2017-11-14 DIAGNOSIS — R51 Headache: Secondary | ICD-10-CM | POA: Insufficient documentation

## 2017-11-14 DIAGNOSIS — Z7982 Long term (current) use of aspirin: Secondary | ICD-10-CM | POA: Diagnosis not present

## 2017-11-14 NOTE — ED Triage Notes (Signed)
[place] [facility] where staff reportedly found pt lying on the floor.  Pt with Alzheimers, states her knees are hurting.   Pt has an old injury to left eye, unsure if from fall.

## 2017-11-14 NOTE — ED Notes (Signed)
Patient transported to CT 

## 2017-11-14 NOTE — ED Notes (Signed)
Pt states she does not remember falling. Per EMS pt was found in the floor. Pt arived by EMS w. Collar on neck. Pt says left knee hurts. Old scar noted on that knee.

## 2017-11-14 NOTE — ED Provider Notes (Signed)
[facility] EMERGENCY DEPARTMENT Provider Note   CSN: [id number] Arrival date & time: 11/14/17  0134     History   Chief Complaint Chief Complaint  Patient presents with  . Fall    HPI Maureen Coleman is a 83 y.o. female.  HPI  Patient comes in with chief complaint of fall. Patient has history of dementia, hyperlipidemia.  Patient reports that she got up, got dizzy and fell down.  Patient is from [facility], and has history of dementia.  Patient was found lying on the floor.  Patient had complained of knee pain to the staff.  Patient is oriented to self, and location.  She is aware that she is in the hospital, and was brought here because of fall.  Past Medical History:  Diagnosis Date  . Dementia   . Depression   . Hyperlipidemia     There are no active problems to display for this patient.   History reviewed. No pertinent surgical history.  OB History    No data available       Home Medications    Prior to Admission medications   Medication Sig Start Date End Date Taking? Authorizing Provider  aspirin EC 81 MG tablet Take 81 mg by mouth daily.    [provider]  atorvastatin (LIPITOR) 20 MG tablet Take 20 mg by mouth every evening.  06/06/16   [provider]  diltiazem (CARDIZEM CD) 180 MG 24 hr capsule Take 180 mg by mouth daily.  06/04/16   [provider]  donepezil (ARICEPT) 10 MG tablet Take 10 mg by mouth every morning.  05/29/16   [provider]  escitalopram (LEXAPRO) 10 MG tablet Take 10 mg by mouth daily.  05/22/16   [provider]  Glucosamine-Chondroitin 250-200 MG CAPS Take 1 tablet by mouth daily.     [provider]  guaifenesin (HUMIBID E) 400 MG TABS tablet Take 400 mg by mouth every 12 (twelve) hours.    [provider]  LORazepam (ATIVAN) 0.5 MG tablet Take 0.5 mg by mouth every 12 (twelve) hours.    [provider]  Melatonin 3 MG TABS Take 3 mg by mouth at  bedtime as needed (sleep).     [provider]  Multiple Vitamin (MULTIVITAMIN WITH MINERALS) TABS tablet Take 1 tablet by mouth daily.    [provider]  omeprazole (PRILOSEC) 20 MG capsule Take 20 mg by mouth every evening.     [provider]  OXYGEN Inhale 2 L into the lungs daily.    [provider]    Family History No family history on file.  Social History Social History   Tobacco Use  . Smoking status: Never Smoker  . Smokeless tobacco: Never Used  Substance Use Topics  . Alcohol use: Not on file  . Drug use: Not on file     Allergies   Adhesive [tape]; Augmentin [amoxicillin-pot clavulanate]; Cefdinir; Cortisone; Feldene [piroxicam]; Glucosamine forte [nutritional supplements]; Labetalol; Latex; Lotensin [benazepril hcl]; Other; Ultracet [tramadol-acetaminophen]; and Vicodin [hydrocodone-acetaminophen]   Review of Systems Review of Systems  Constitutional: Negative for activity change.  Eyes: Negative for visual disturbance.  Respiratory: Negative for shortness of breath.   Cardiovascular: Negative for chest pain.  Gastrointestinal: Negative for abdominal pain.  Musculoskeletal: Negative for back pain and neck pain.  Neurological: Negative for headaches.  Hematological: Does not bruise/bleed easily.     Physical Exam Updated Vital Signs BP (!) 156/66 (BP Location: Right Arm)  Pulse 74   Temp 98 F (36.7 C) (Oral)   Resp 16   SpO2 100%   Physical Exam  Constitutional: She is oriented to person, place, and time. She appears well-developed and well-nourished.  HENT:  Head: Normocephalic and atraumatic.  Eyes: EOM are normal. Pupils are equal, round, and reactive to light.  Left eye has some periorbital edema. EOMI. Gross visual acuity exam is normal.  Neck: Neck supple.  No midline c-spine tenderness  Cardiovascular: Normal rate and regular rhythm.  No murmur heard. Pulmonary/Chest: Effort normal and breath sounds  normal. No respiratory distress. She exhibits no tenderness.  Abdominal: Soft. Bowel sounds are normal. She exhibits no distension. There is no tenderness.  Musculoskeletal:  No long bone tenderness - upper and lower extrmeities and no pelvic pain, instability.  Neurological: She is alert and oriented to person, place, and time. No cranial nerve deficit.  Skin: Skin is warm and dry. No rash noted.  Nursing note and vitals reviewed.    ED Treatments / Results  Labs (all labs ordered are listed, but only abnormal results are displayed) Labs Reviewed - No data to display  EKG  EKG Interpretation None       Radiology Ct Head Wo Contrast  Result Date: 11/14/2017 CLINICAL DATA:  Acute onset of headache, and erythema and bleeding at the left eye. EXAM: CT HEAD WITHOUT CONTRAST TECHNIQUE: Contiguous axial images were obtained from the base of the skull through the vertex without intravenous contrast. COMPARISON:  CT of the head performed 11/06/2017 FINDINGS: Brain: No evidence of acute infarction, hemorrhage, hydrocephalus, extra-axial collection or mass lesion/mass effect. Prominence of the ventricles and sulci reflects mild to moderate cortical volume loss. Scattered periventricular and subcortical white matter change likely reflects small vessel ischemic microangiopathy. The brainstem and fourth ventricle are within normal limits. The basal ganglia are unremarkable in appearance. The cerebral hemispheres demonstrate grossly normal gray-white differentiation. No mass effect or midline shift is seen. Vascular: No hyperdense vessel or unexpected calcification. Skull: There is no evidence of fracture; visualized osseous structures are unremarkable in appearance. Sinuses/Orbits: The orbits are within normal limits. There is mild partial opacification of the right mastoid air cells. The paranasal sinuses and left mastoid air cells are well-aerated. Other: No significant soft tissue abnormalities are  seen. IMPRESSION: 1. No acute intracranial pathology seen on CT. 2. Mild to moderate cortical volume loss and scattered small vessel ischemic microangiopathy. 3. Mild partial opacification of the right mastoid air cells. Electronically Signed   By: Roanna RaiderJeffery  Chang M.D.   On: 11/14/2017 03:57    Procedures Procedures (including critical care time)  Medications Ordered in ED Medications - No data to display   Initial Impression / Assessment and Plan / ED Course  I have reviewed the triage vital signs and the nursing notes.  Pertinent labs & imaging results that were available during my care of the patient were reviewed by me and considered in my medical decision making (see chart for details).  Clinical Course as of Nov 14 612  Sat Nov 14, 2017  0612 RN has ambulated the patient, she did well.  [AN]    Clinical Course User Index [AN] Derwood KaplanNanavati, Teagen Mcleary, MD    DDx includes: - Mechanical falls - ICH - Fractures - Contusions - Soft tissue injury  Patient comes in with chief complaint of fall. Patient reports that she got dizzy before she fell down.  No syncope. Brain CT ordered.  C-spine cleared clinically. CT head is  normal, patient has ambulated.  Will discharge. Final Clinical Impressions(s) / ED Diagnoses   Final diagnoses:  Fall, initial encounter  Multiple contusions    ED Discharge Orders    None       Derwood KaplanNanavati, Jasiri Hanawalt, MD 11/14/17 [id number]

## 2017-11-14 NOTE — Discharge Instructions (Signed)
We saw you in the ER after you had a fall. °All the imaging results are normal, no fractures seen. No evidence of brain bleed. °Please be very careful with walking, and do everything possible to prevent falls. ° ° °

## 2017-11-14 NOTE — ED Notes (Signed)
While ambulating the pt. She seemed to stumble right after wiping her left eye, and was appeared to be unsure of her footing;however, she was able to walk all the way around the desk and return to the room without assistance.

## 2017-11-26 ENCOUNTER — Other Ambulatory Visit: Payer: Self-pay

## 2017-11-26 ENCOUNTER — Encounter (HOSPITAL_COMMUNITY): Payer: Self-pay | Admitting: Emergency Medicine

## 2017-11-26 ENCOUNTER — Emergency Department (HOSPITAL_COMMUNITY)
Admission: EM | Admit: 2017-11-26 | Discharge: 2017-11-27 | Disposition: A | Discharge status: Home / Self Care | Payer: Medicare Other | Attending: Emergency Medicine | Admitting: Emergency Medicine

## 2017-11-26 DIAGNOSIS — Z880 Allergy status to penicillin: Secondary | ICD-10-CM | POA: Diagnosis not present

## 2017-11-26 DIAGNOSIS — Z79899 Other long term (current) drug therapy: Secondary | ICD-10-CM | POA: Insufficient documentation

## 2017-11-26 DIAGNOSIS — S00411A Abrasion of right ear, initial encounter: Secondary | ICD-10-CM | POA: Insufficient documentation

## 2017-11-26 DIAGNOSIS — R42 Dizziness and giddiness: Secondary | ICD-10-CM | POA: Diagnosis not present

## 2017-11-26 DIAGNOSIS — Y939 Activity, unspecified: Secondary | ICD-10-CM | POA: Insufficient documentation

## 2017-11-26 DIAGNOSIS — Z885 Allergy status to narcotic agent status: Secondary | ICD-10-CM | POA: Diagnosis not present

## 2017-11-26 DIAGNOSIS — Z7982 Long term (current) use of aspirin: Secondary | ICD-10-CM | POA: Diagnosis not present

## 2017-11-26 DIAGNOSIS — F0390 Unspecified dementia without behavioral disturbance: Secondary | ICD-10-CM | POA: Insufficient documentation

## 2017-11-26 DIAGNOSIS — R000 Tachycardia, unspecified: Secondary | ICD-10-CM | POA: Diagnosis not present

## 2017-11-26 DIAGNOSIS — Y92122 Bedroom in nursing home as the place of occurrence of the external cause: Secondary | ICD-10-CM | POA: Diagnosis not present

## 2017-11-26 DIAGNOSIS — W19XXXA Unspecified fall, initial encounter: Secondary | ICD-10-CM | POA: Diagnosis not present

## 2017-11-26 DIAGNOSIS — Y998 Other external cause status: Secondary | ICD-10-CM | POA: Insufficient documentation

## 2017-11-26 LAB — CBC
HCT: 40.5 % (ref 36.0–46.0)
HEMOGLOBIN: 13.1 g/dL (ref 12.0–15.0)
MCH: 31.4 pg (ref 26.0–34.0)
MCHC: 32.3 g/dL (ref 30.0–36.0)
MCV: 97.1 fL (ref 78.0–100.0)
Platelets: 208 10*3/uL (ref 150–400)
RBC: 4.17 MIL/uL (ref 3.87–5.11)
RDW: 14 % (ref 11.5–15.5)
WBC: 7 10*3/uL (ref 4.0–10.5)

## 2017-11-26 LAB — CBG MONITORING, ED: Glucose-Capillary: 162 mg/dL — ABNORMAL HIGH (ref 65–99)

## 2017-11-26 NOTE — ED Triage Notes (Signed)
Pt came by ambulance for [facility]. Pt complains of left ear pain. Pt denies any other pain. Pt is confused, claims she did not use her walker, states she is currently feeling dizzy.

## 2017-11-26 NOTE — ED Provider Notes (Signed)
[facility] EMERGENCY DEPARTMENT Provider Note   CSN: [id number] Arrival date & time: 11/26/17  2315     History   Chief Complaint Chief Complaint  Patient presents with  . Fall  . Dizziness  Level 5 caveat due to dementia  HPI Maureen Coleman is a 84 y.o. female.  The history is provided by the nursing home and the patient. The history is limited by the condition of the patient.  Fall  This is a new problem. Episode onset: Prior to arrival. The problem occurs constantly. The problem has not changed since onset.Nothing aggravates the symptoms. Nothing relieves the symptoms.   The patient presents from nursing home s/p unwitnessed fall Apparently patient fell in her room this time the floor with bleeding from her ear No other acute issues at this time, patient with history of dementia and is at baseline mental status Called the nursing facility to confim story, and they report they heard her fall in her room and they found her on the floor with blood in her right ear no other acute injuries and no other changes in her mental status Past Medical History:  Diagnosis Date  . Dementia   . Depression   . Hyperlipidemia     There are no active problems to display for this patient.   History reviewed. No pertinent surgical history.  OB History    No data available       Home Medications    Prior to Admission medications   Medication Sig Start Date End Date Taking? Authorizing Provider  aspirin EC 81 MG tablet Take 81 mg by mouth daily.    [provider]  atorvastatin (LIPITOR) 20 MG tablet Take 20 mg by mouth every evening.  06/06/16   [provider]  diltiazem (CARDIZEM CD) 180 MG 24 hr capsule Take 180 mg by mouth daily.  06/04/16   [provider]  donepezil (ARICEPT) 10 MG tablet Take 10 mg by mouth every morning.  05/29/16   [provider]  escitalopram (LEXAPRO) 10 MG tablet Take 10 mg by mouth daily.  05/22/16   [provider]  Glucosamine-Chondroitin 250-200 MG CAPS Take 1 tablet by mouth daily.     [provider]  guaifenesin (HUMIBID E) 400 MG TABS tablet Take 400 mg by mouth every 12 (twelve) hours.    [provider]  LORazepam (ATIVAN) 0.5 MG tablet Take 0.5 mg by mouth every 12 (twelve) hours.    [provider]  Melatonin 3 MG TABS Take 3 mg by mouth at bedtime as needed (sleep).     [provider]  Multiple Vitamin (MULTIVITAMIN WITH MINERALS) TABS tablet Take 1 tablet by mouth daily.    [provider]  omeprazole (PRILOSEC) 20 MG capsule Take 20 mg by mouth every evening.     [provider]  OXYGEN Inhale 2 L into the lungs daily.    [provider]    Family History History reviewed. No pertinent family history.  Social History Social History   Tobacco Use  . Smoking status: Never Smoker  . Smokeless tobacco: Never Used  Substance Use Topics  . Alcohol use: Not on file  . Drug use: Not on file     Allergies   Adhesive [tape]; Augmentin [amoxicillin-pot clavulanate]; Cefdinir; Cortisone; Feldene [piroxicam]; Glucosamine forte [nutritional supplements]; Labetalol; Latex; Lotensin [benazepril hcl]; Other; Ultracet [tramadol-acetaminophen]; and Vicodin [hydrocodone-acetaminophen]   Review of Systems Review of Systems  Unable to perform ROS:  Dementia     Physical Exam Updated Vital Signs BP 92/62 (BP Location: Left Arm)   Pulse (!) 141   Temp 98.4 F (36.9 C) (Oral)   Resp 20   SpO2 96%   Physical Exam CONSTITUTIONAL: Elderly, no acute distress HEAD: Normocephalic/atraumatic, no bruising or tenderness to scalp EYES: EOMI ENMT: Mucous membranes moist, mild abrasion to right ear, no lacerations noted, no bruising noted no tenderness noted NECK: supple no meningeal signs SPINE/BACK:entire spine nontender CV: Tachycardic LUNGS: Lungs are clear to auscultation bilaterally, no apparent distress Chest -no bruising or  tenderness noted ABDOMEN: soft GU:no cva tenderness NEURO: Pt is awake/alert, moves all extremities x4, pleasantly demented EXTREMITIES: pulses normal/equal, full ROM, no tenderness, pelvis stable, no deformities noted SKIN: warm, color normal   ED Treatments / Results  Labs (all labs ordered are listed, but only abnormal results are displayed) Labs Reviewed  BASIC METABOLIC PANEL - Abnormal; Notable for the following components:      Result Value   Glucose, Bld 174 (*)    All other components within normal limits  CBG MONITORING, ED - Abnormal; Notable for the following components:   Glucose-Capillary 162 (*)    All other components within normal limits  CBC    EKG ED ECG REPORT   Date: 11/26/2017 2327  Rate: 141  Rhythm: supraventricular tachycardia (SVT)  QRS Axis: normal  Intervals: normal  ST/T Wave abnormalities: nonspecific ST changes  Conduction Disutrbances:none  I have personally reviewed the EKG tracing and agree with the computerized printout as noted.   ED ECG REPORT   Date: 11/27/2017 0027  Rate: 78  Rhythm: normal sinus rhythm  QRS Axis: normal  Intervals: normal  ST/T Wave abnormalities: normal  Conduction Disutrbances:none  Narrative Interpretation:  Improved from prior, HR improved I have personally reviewed the EKG tracing and agree with the computerized printout as noted.  Radiology No results found.  Procedures Procedures (including critical care time)  Medications Ordered in ED Medications - No data to display   Initial Impression / Assessment and Plan / ED Course  I have reviewed the triage vital signs and the nursing notes.  Pertinent labs  results that were available during my care of the patient were reviewed by me and considered in my medical decision making (see chart for details).     12:04 AM Has small abrasion to right ear, but no other signs of head injury.  Will defer CT imaging for now She is tachycardic, but this  appears to be improving spontaneously we will get repeat EKG and check labs. 1:40 AM Patient was able to ambulate here in the emergency department with assistance.  At this point I feel she is safe for discharge, she is in no distress, she is able to ambulate, and her vitals are improved Did have brief episode of tachycardia that quickly improved in the emergency department Will discharge back to facility Final Clinical Impressions(s) / ED Diagnoses   Final diagnoses:  Fall, initial encounter  Abrasion of right ear, initial encounter    ED Discharge Orders    None       Zadie RhineWickline, Genee Rann, MD 11/27/17 [phone]

## 2017-11-27 ENCOUNTER — Other Ambulatory Visit: Payer: Self-pay

## 2017-11-27 LAB — BASIC METABOLIC PANEL
Anion gap: 12 (ref 5–15)
BUN: 15 mg/dL (ref 6–20)
CALCIUM: 9 mg/dL (ref 8.9–10.3)
CHLORIDE: 103 mmol/L (ref 101–111)
CO2: 24 mmol/L (ref 22–32)
Creatinine, Ser: 0.8 mg/dL (ref 0.44–1.00)
GFR calc Af Amer: >60 mL/min (ref >60)
GFR calc non Af Amer: >60 mL/min (ref >60)
GLUCOSE: 174 mg/dL — AB (ref 65–99)
Potassium: 4 mmol/L (ref 3.5–5.1)
Sodium: 139 mmol/L (ref 135–145)

## 2017-11-27 NOTE — ED Notes (Signed)
Called [facility], facility states they do not provide transportation for patients.

## 2017-11-27 NOTE — ED Notes (Signed)
Ambulated in hallway with assistance. Pt stated at times she had some dizziness. Pt states she normally walks with her walker.

## 2018-01-19 ENCOUNTER — Encounter (HOSPITAL_COMMUNITY): Payer: Self-pay | Admitting: *Deleted

## 2018-01-19 ENCOUNTER — Emergency Department (HOSPITAL_COMMUNITY)
Admission: EM | Admit: 2018-01-19 | Discharge: 2018-01-19 | Disposition: A | Discharge status: Home / Self Care | Payer: Medicare Other | Attending: Emergency Medicine | Admitting: Emergency Medicine

## 2018-01-19 ENCOUNTER — Other Ambulatory Visit: Payer: Self-pay

## 2018-01-19 ENCOUNTER — Emergency Department (HOSPITAL_COMMUNITY): Payer: Medicare Other

## 2018-01-19 DIAGNOSIS — Y999 Unspecified external cause status: Secondary | ICD-10-CM | POA: Insufficient documentation

## 2018-01-19 DIAGNOSIS — W01198A Fall on same level from slipping, tripping and stumbling with subsequent striking against other object, initial encounter: Secondary | ICD-10-CM | POA: Insufficient documentation

## 2018-01-19 DIAGNOSIS — Y92129 Unspecified place in nursing home as the place of occurrence of the external cause: Secondary | ICD-10-CM | POA: Diagnosis not present

## 2018-01-19 DIAGNOSIS — Z79899 Other long term (current) drug therapy: Secondary | ICD-10-CM | POA: Insufficient documentation

## 2018-01-19 DIAGNOSIS — S0181XA Laceration without foreign body of other part of head, initial encounter: Secondary | ICD-10-CM

## 2018-01-19 DIAGNOSIS — S065XAA Traumatic subdural hemorrhage with loss of consciousness status unknown, initial encounter: Secondary | ICD-10-CM

## 2018-01-19 DIAGNOSIS — Z7982 Long term (current) use of aspirin: Secondary | ICD-10-CM | POA: Insufficient documentation

## 2018-01-19 DIAGNOSIS — Z91040 Latex allergy status: Secondary | ICD-10-CM | POA: Insufficient documentation

## 2018-01-19 DIAGNOSIS — F0390 Unspecified dementia without behavioral disturbance: Secondary | ICD-10-CM | POA: Diagnosis not present

## 2018-01-19 DIAGNOSIS — S161XXA Strain of muscle, fascia and tendon at neck level, initial encounter: Secondary | ICD-10-CM

## 2018-01-19 DIAGNOSIS — Y9389 Activity, other specified: Secondary | ICD-10-CM | POA: Diagnosis not present

## 2018-01-19 DIAGNOSIS — S065X9A Traumatic subdural hemorrhage with loss of consciousness of unspecified duration, initial encounter: Secondary | ICD-10-CM

## 2018-01-19 DIAGNOSIS — W19XXXA Unspecified fall, initial encounter: Secondary | ICD-10-CM

## 2018-01-19 MED ORDER — POVIDONE-IODINE 10 % EX SOLN
CUTANEOUS | Status: AC
  Filled 2018-01-19: qty 15

## 2018-01-19 MED ORDER — LIDOCAINE HCL (PF) 2 % IJ SOLN
INTRAMUSCULAR | Status: AC
  Filled 2018-01-19: qty 10

## 2018-01-19 MED ORDER — LIDOCAINE HCL (PF) 2 % IJ SOLN
5.0000 mL | Freq: Once | INTRAMUSCULAR | Status: AC
  Administered 2018-01-19: 5 mL via INTRADERMAL

## 2018-01-19 MED ORDER — HYDROGEN PEROXIDE 3 % EX SOLN
CUTANEOUS | Status: AC
  Filled 2018-01-19: qty 473

## 2018-01-19 NOTE — ED Notes (Signed)
Spoke with Marylene LandAngela from [facility] about discharge paperwork and coming to get pt. Facility will pick her up within an hour.

## 2018-01-19 NOTE — Discharge Instructions (Signed)
Local wound care with bacitracin and dressing changes twice daily.  Sutures are to be removed in 7 days.  Return to the emergency department for severe headache, changes in behavior, difficulty waking, seizure activity, or other new and concerning symptoms.

## 2018-01-19 NOTE — ED Triage Notes (Signed)
Pt brought in by [facility] for c/o fall and laceration; pt states she got up to go to bathroom and doesn't know what happened after that; pt has laceration to right eye and top of head

## 2018-01-19 NOTE — ED Provider Notes (Signed)
[facility] EMERGENCY DEPARTMENT Provider Note   CSN: [id number] Arrival date & time: 01/19/18  4098     History   Chief Complaint Chief Complaint  Patient presents with  . Laceration  . Fall    HPI Maureen Coleman is a 84 y.o. female.  Patient is an 84 year old female with history of dementia, brought by EMS after a fall.  She apparently was getting up to go to the bathroom at her extended care facility when she fell forward and struck her face on the ground.  She is uncertain as to whether she lost consciousness and does not have much recollection of the events.  She is complaining of pain to the right forehead.  She has a laceration above the right eyebrow and abrasions/laceration inferior to the lateral canthus of the right eye.   The history is provided by the patient.  Laceration   The incident occurred less than 1 hour ago. The laceration is located on the face. The laceration is 3 cm in size. The laceration mechanism is unknown.The pain is moderate. The pain has been constant since onset.  Fall     Past Medical History:  Diagnosis Date  . Dementia   . Depression   . Hyperlipidemia     There are no active problems to display for this patient.   History reviewed. No pertinent surgical history.  OB History    No data available       Home Medications    Prior to Admission medications   Medication Sig Start Date End Date Taking? Authorizing Provider  aspirin EC 81 MG tablet Take 81 mg by mouth daily.    [provider]  atorvastatin (LIPITOR) 20 MG tablet Take 20 mg by mouth every evening.  06/06/16   [provider]  diltiazem (CARDIZEM CD) 180 MG 24 hr capsule Take 180 mg by mouth daily.  06/04/16   [provider]  donepezil (ARICEPT) 10 MG tablet Take 10 mg by mouth every morning.  05/29/16   [provider]  escitalopram (LEXAPRO) 10 MG tablet Take 10 mg by mouth daily.  05/22/16   [provider]    Glucosamine-Chondroitin 250-200 MG CAPS Take 1 tablet by mouth daily.     [provider]  guaifenesin (HUMIBID E) 400 MG TABS tablet Take 400 mg by mouth every 12 (twelve) hours.    [provider]  LORazepam (ATIVAN) 0.5 MG tablet Take 0.5 mg by mouth every 12 (twelve) hours.    [provider]  Melatonin 3 MG TABS Take 3 mg by mouth at bedtime as needed (sleep).     [provider]  Multiple Vitamin (MULTIVITAMIN WITH MINERALS) TABS tablet Take 1 tablet by mouth daily.    [provider]  omeprazole (PRILOSEC) 20 MG capsule Take 20 mg by mouth every evening.     [provider]  OXYGEN Inhale 2 L into the lungs daily.    [provider]    Family History History reviewed. No pertinent family history.  Social History Social History   Tobacco Use  . Smoking status: Never Smoker  . Smokeless tobacco: Never Used  Substance Use Topics  . Alcohol use: Not on file  . Drug use: Not on file     Allergies   Adhesive [tape]; Augmentin [amoxicillin-pot clavulanate]; Cefdinir; Cortisone; Feldene [piroxicam]; Glucosamine forte [nutritional supplements]; Labetalol; Latex; Lotensin [benazepril hcl]; Other; Ultracet [tramadol-acetaminophen]; and Vicodin [hydrocodone-acetaminophen]   Review of Systems Review of Systems  All other systems reviewed and are negative.    Physical Exam Updated Vital Signs BP (!) 152/70 (BP Location: Left Arm)   Pulse 67   Temp 97.7 F (36.5 C) (Oral)   Resp 20   SpO2 98%   Physical Exam  Constitutional: She appears well-developed and well-nourished. No distress.  HENT:  Head: Normocephalic.  There is a 3.5 cm laceration above the right eyebrow.  There is also macerated tissue just below the corner of the right eye.  Eyes: EOM are normal. Pupils are equal, round, and reactive to light.  Neck: Normal range of motion. Neck supple.  Cardiovascular: Normal rate and regular rhythm.   Pulmonary/Chest: Effort normal and breath sounds normal.  Musculoskeletal: Normal range of motion. She exhibits no deformity.  Neurological: She is alert. No cranial nerve deficit. She exhibits normal muscle tone.  Skin: Skin is warm and dry. She is not diaphoretic.  Nursing note and vitals reviewed.    ED Treatments / Results  Labs (all labs ordered are listed, but only abnormal results are displayed) Labs Reviewed - No data to display  EKG  EKG Interpretation None       Radiology No results found.  Procedures Procedures (including critical care time)  Medications Ordered in ED Medications  povidone-iodine (BETADINE) 10 % external solution (not administered)  lidocaine (XYLOCAINE) 2 % injection 5 mL (5 mLs Intradermal Given 01/19/18 0457)     Initial Impression / Assessment and Plan / ED Course  I have reviewed the triage vital signs and the nursing notes.  Pertinent labs & imaging results that were available during my care of the patient were reviewed by me and considered in my medical decision making (see chart for details).  Patient brought by EMS after a fall at the nursing home.  She has a history of dementia which makes obtaining a history somewhat difficult.  She does not recall the fall and I am uncertain as to whether this is related to her dementia or a head injury.  For this reason, a head CT was obtained along with imaging of the cervical spine and facial bones.  The head CT showed a very small subdural hemorrhage along the temporoparietal region.  This finding was discussed with Dr. Dutch QuintPoole from neurosurgery who feels as though the patient is appropriate for return to the nursing home.  He believes it would be exceedingly rare for this area to progress to a more significant bleed.  She has a laceration to her forehead just above the eyebrow.  This was repaired as well as a smaller laceration just under her right eye.  The tissue in this area is macerated and this  is more of a skin tear than an actual laceration.  I did attempt to pull the edges together using 2 sutures.  LACERATION REPAIR Performed by: Geoffery Lyonsouglas Lovelee Forner Authorized by: Geoffery Lyonsouglas Otie Headlee Consent: Verbal consent obtained. Risks and benefits: risks, benefits and alternatives were discussed Consent given by: patient Patient identity confirmed: provided demographic data Prepped and Draped in normal sterile fashion Wound explored  Laceration Location: Right forehead, corner of right eye  Laceration Length: 3.5, 1 cm  No Foreign Bodies seen or palpated  Anesthesia: local infiltration  Local anesthetic: lidocaine 2 % with epinephrine  Anesthetic total: 5 ml  Irrigation method: syringe Amount of cleaning: standard  Skin closure: 5-0 Ethilon  Number of sutures: 5, 2  Technique: Simple interrupted  Patient tolerance: Patient tolerated the procedure well with no immediate complications.  Final Clinical Impressions(s) / ED Diagnoses   Final diagnoses:  None    ED Discharge Orders    None       Geoffery Lyons, MD 01/19/18 [id number]

## 2018-01-22 ENCOUNTER — Other Ambulatory Visit: Payer: Self-pay

## 2018-01-22 ENCOUNTER — Emergency Department (HOSPITAL_COMMUNITY): Payer: Medicare Other

## 2018-01-22 ENCOUNTER — Encounter (HOSPITAL_COMMUNITY): Payer: Self-pay | Admitting: Emergency Medicine

## 2018-01-22 ENCOUNTER — Emergency Department (HOSPITAL_COMMUNITY)
Admission: EM | Admit: 2018-01-22 | Discharge: 2018-01-22 | Disposition: A | Discharge status: Home / Self Care | Payer: Medicare Other | Attending: Emergency Medicine | Admitting: Emergency Medicine

## 2018-01-22 DIAGNOSIS — W19XXXA Unspecified fall, initial encounter: Secondary | ICD-10-CM | POA: Diagnosis not present

## 2018-01-22 DIAGNOSIS — S0003XA Contusion of scalp, initial encounter: Secondary | ICD-10-CM | POA: Insufficient documentation

## 2018-01-22 DIAGNOSIS — Z7982 Long term (current) use of aspirin: Secondary | ICD-10-CM | POA: Insufficient documentation

## 2018-01-22 DIAGNOSIS — S0990XA Unspecified injury of head, initial encounter: Secondary | ICD-10-CM

## 2018-01-22 DIAGNOSIS — Z79899 Other long term (current) drug therapy: Secondary | ICD-10-CM | POA: Diagnosis not present

## 2018-01-22 DIAGNOSIS — F0390 Unspecified dementia without behavioral disturbance: Secondary | ICD-10-CM | POA: Insufficient documentation

## 2018-01-22 DIAGNOSIS — Y998 Other external cause status: Secondary | ICD-10-CM | POA: Diagnosis not present

## 2018-01-22 DIAGNOSIS — Y939 Activity, unspecified: Secondary | ICD-10-CM | POA: Insufficient documentation

## 2018-01-22 DIAGNOSIS — M545 Low back pain: Secondary | ICD-10-CM | POA: Diagnosis not present

## 2018-01-22 DIAGNOSIS — Y92129 Unspecified place in nursing home as the place of occurrence of the external cause: Secondary | ICD-10-CM | POA: Diagnosis not present

## 2018-01-22 DIAGNOSIS — Z91040 Latex allergy status: Secondary | ICD-10-CM | POA: Insufficient documentation

## 2018-01-22 NOTE — ED Notes (Signed)
MD at the bedside to speak with daughter about result and pt need for more care.

## 2018-01-22 NOTE — ED Notes (Signed)
Report called to Molli HazardMatthew at [facility], Will have to call EMS for transport back to facility

## 2018-01-22 NOTE — ED Notes (Signed)
MD at the bedside  

## 2018-01-22 NOTE — ED Notes (Signed)
[facility] called back they will come get pt in 20 minutes

## 2018-01-22 NOTE — ED Notes (Signed)
Pt on and off bed pan without difficulty

## 2018-01-22 NOTE — ED Triage Notes (Signed)
Pt from [facility]. Found supine beside the bed. C/o of pain in lower back. Pt states she hit the back of her head.    Pt fell 3/26. Bruises to the right eye from previous fall. Pt a/o x4

## 2018-01-22 NOTE — ED Notes (Signed)
Pt leaving with [facility], Daughter signed and given discharge instruction, verbalized understand. Patient wheelchair out of the department.

## 2018-01-22 NOTE — ED Provider Notes (Signed)
[facility] EMERGENCY DEPARTMENT Provider Note   CSN: [id number] Arrival date & time: 01/22/18  1357     History   Chief Complaint Chief Complaint  Patient presents with  . Fall    HPI Maureen Coleman is a 84 y.o. female.  Level 5 caveat for dementia.  Patient apparently was found supine at the side of her bed [facility].  She allegedly hit the back of her head.  No change in her behavior.  She has not been ill lately.  S/p fall on 01/19/18 resulting in an emergency visit and laceration repair surrounding the right eye.  Patient has no complaints today.      Past Medical History:  Diagnosis Date  . Dementia   . Depression   . Hyperlipidemia     There are no active problems to display for this patient.   History reviewed. No pertinent surgical history.  OB History    No data available       Home Medications    Prior to Admission medications   Medication Sig Start Date End Date Taking? Authorizing Provider  acetaminophen (TYLENOL) 500 MG tablet Take 500 mg by mouth 2 (two) times daily.   Yes [provider]  aspirin EC 81 MG tablet Take 81 mg by mouth daily.   Yes [provider]  atorvastatin (LIPITOR) 20 MG tablet Take 20 mg by mouth every evening.  06/06/16  Yes [provider]  carboxymethylcellulose (REFRESH PLUS) 0.5 % SOLN Place 1 drop into the left eye 2 (two) times daily as needed (dry eye).   Yes [provider]  diclofenac sodium (VOLTAREN) 1 % GEL Apply 1 application topically every evening.   Yes [provider]  diltiazem (CARDIZEM CD) 180 MG 24 hr capsule Take 180 mg by mouth daily.  06/04/16  Yes [provider]  donepezil (ARICEPT) 10 MG tablet Take 10 mg by mouth every morning.  05/29/16  Yes [provider]  escitalopram (LEXAPRO) 10 MG tablet Take 10 mg by mouth daily.  05/22/16  Yes [provider]  folic acid (FOLVITE) 1 MG tablet Take 800 mcg by mouth daily.   Yes  [provider]  Glucosamine-Chondroitin 250-200 MG CAPS Take 1 tablet by mouth daily.    Yes [provider]  guaifenesin (HUMIBID E) 400 MG TABS tablet Take 400 mg by mouth every 12 (twelve) hours.   Yes [provider]  Lidocaine HCl (ASPERCREME W/LIDOCAINE) 4 % CREA Apply 1 application topically every evening. Apply to right knee topically every day and evening shift for pain.   Yes [provider]  LORazepam (ATIVAN) 0.5 MG tablet Take 0.5 mg by mouth every 12 (twelve) hours.   Yes [provider]  Melatonin 3 MG TABS Take 3 mg by mouth at bedtime as needed (sleep).    Yes [provider]  Multiple Vitamin (MULTIVITAMIN WITH MINERALS) TABS tablet Take 1 tablet by mouth daily.   Yes [provider]  omeprazole (PRILOSEC) 20 MG capsule Take 20 mg by mouth every evening.    Yes [provider]  POTASSIUM CHLORIDE PO Take 1 tablet by mouth daily. Give 1 tablet by mouth one time a day for supplement.   Yes [provider]  OXYGEN Inhale 2 L into the lungs daily.    [provider]    Family History No family history on file.  Social History Social History   Tobacco Use  . Smoking status: Never  Smoker  . Smokeless tobacco: Never Used  Substance Use Topics  . Alcohol use: Not on file  . Drug use: Not on file     Allergies   Adhesive [tape]; Augmentin [amoxicillin-pot clavulanate]; Cefdinir; Cortisone; Feldene [piroxicam]; Glucosamine forte [nutritional supplements]; Labetalol; Latex; Lotensin [benazepril hcl]; Other; Ultracet [tramadol-acetaminophen]; and Vicodin [hydrocodone-acetaminophen]   Review of Systems Review of Systems  Unable to perform ROS: Dementia     Physical Exam Updated Vital Signs BP (!) 126/57   Pulse 65   Temp 98.1 F (36.7 C) (Oral)   Resp 18   Ht 5\' 5"  (1.651 m)   SpO2 99%   BMI 26.63 kg/m   Physical Exam  Constitutional: She is oriented to person, place, and  time. She appears well-developed and well-nourished.  No acute distress  HENT:  Head: Normocephalic.  Ecchymosis surrounding the right eye.  Healing laceration on lateral and superior aspect of the eye.  Left parietal hematoma.  Eyes: Conjunctivae are normal.  Neck: Neck supple.  Cardiovascular: Normal rate and regular rhythm.  Pulmonary/Chest: Effort normal and breath sounds normal.  Abdominal: Soft. Bowel sounds are normal.  Musculoskeletal: Normal range of motion.  Neurological: She is alert and oriented to person, place, and time.  Skin: Skin is warm and dry.  Psychiatric: She has a normal mood and affect. Her behavior is normal.  Nursing note and vitals reviewed.    ED Treatments / Results  Labs (all labs ordered are listed, but only abnormal results are displayed) Labs Reviewed - No data to display  EKG  EKG Interpretation None       Radiology Ct Head Wo Contrast  Result Date: 01/22/2018 CLINICAL DATA:  Unwitnessed fall. EXAM: CT HEAD WITHOUT CONTRAST TECHNIQUE: Contiguous axial images were obtained from the base of the skull through the vertex without intravenous contrast. COMPARISON:  CT scan of January 19, 2018. FINDINGS: Brain: Mild diffuse cortical atrophy is noted. Mild chronic ischemic white matter disease is noted. Small right frontal scalp hematoma is noted. No mass effect or midline shift is noted. Ventricular size is within normal limits. There is no evidence of mass lesion, hemorrhage or acute infarction. Vascular: No hyperdense vessel or unexpected calcification. Skull: Normal. Negative for fracture or focal lesion. Sinuses/Orbits: No acute finding. Other: Large left parietal scalp hematoma is noted. IMPRESSION: Small right frontal scalp hematoma which was present on prior exam. New large left parietal scalp hematoma is noted. Mild diffuse cortical atrophy. Mild chronic ischemic white matter disease. No acute intracranial abnormality seen. Electronically Signed    By: Lupita Raider, M.D.   On: 01/22/2018 15:01    Procedures Procedures (including critical care time)  Medications Ordered in ED Medications - No data to display   Initial Impression / Assessment and Plan / ED Course  I have reviewed the triage vital signs and the nursing notes.  Pertinent labs & imaging results that were available during my care of the patient were reviewed by me and considered in my medical decision making (see chart for details).     Patient has no complaints of pain to examiner.  CT head shows a left parietal hematoma.  She has no new gross neurological deficits.  Stable for discharge.  Final Clinical Impressions(s) / ED Diagnoses   Final diagnoses:  Fall, initial encounter  Minor head injury, initial encounter    ED Discharge Orders    None       Donnetta Hutching, MD 01/22/18 1730

## 2018-01-22 NOTE — Discharge Instructions (Signed)
CT scan of the head showed no acute injury.  Follow-up with your primary care doctor.

## 2018-05-12 ENCOUNTER — Encounter (HOSPITAL_COMMUNITY): Payer: Self-pay

## 2018-05-12 ENCOUNTER — Emergency Department (HOSPITAL_COMMUNITY)
Admission: EM | Admit: 2018-05-12 | Discharge: 2018-05-13 | Disposition: A | Discharge status: Home / Self Care | Payer: Medicare Other | Attending: Emergency Medicine | Admitting: Emergency Medicine

## 2018-05-12 DIAGNOSIS — Z91040 Latex allergy status: Secondary | ICD-10-CM | POA: Insufficient documentation

## 2018-05-12 DIAGNOSIS — F0390 Unspecified dementia without behavioral disturbance: Secondary | ICD-10-CM | POA: Diagnosis not present

## 2018-05-12 DIAGNOSIS — Z79899 Other long term (current) drug therapy: Secondary | ICD-10-CM | POA: Diagnosis not present

## 2018-05-12 DIAGNOSIS — R079 Chest pain, unspecified: Secondary | ICD-10-CM | POA: Diagnosis present

## 2018-05-12 DIAGNOSIS — R072 Precordial pain: Secondary | ICD-10-CM

## 2018-05-12 DIAGNOSIS — F329 Major depressive disorder, single episode, unspecified: Secondary | ICD-10-CM | POA: Diagnosis not present

## 2018-05-12 DIAGNOSIS — Z7982 Long term (current) use of aspirin: Secondary | ICD-10-CM | POA: Insufficient documentation

## 2018-05-12 HISTORY — DX: Unspecified atrial fibrillation: I48.91

## 2018-05-12 NOTE — ED Triage Notes (Signed)
Pt is from [facility], [facility] for chest pain she c/o x 1 hour ago.  Pt denies pain at this time.

## 2018-05-13 ENCOUNTER — Emergency Department (HOSPITAL_COMMUNITY): Payer: Medicare Other

## 2018-05-13 DIAGNOSIS — R072 Precordial pain: Secondary | ICD-10-CM | POA: Diagnosis not present

## 2018-05-13 LAB — CBC WITH DIFFERENTIAL/PLATELET
Basophils Absolute: 0 10*3/uL (ref 0.0–0.1)
Basophils Relative: 0 %
Eosinophils Absolute: 0.2 10*3/uL (ref 0.0–0.7)
Eosinophils Relative: 3 %
HCT: 35.3 % — ABNORMAL LOW (ref 36.0–46.0)
HEMOGLOBIN: 11.3 g/dL — AB (ref 12.0–15.0)
LYMPHS ABS: 2 10*3/uL (ref 0.7–4.0)
LYMPHS PCT: 29 %
MCH: 30.4 pg (ref 26.0–34.0)
MCHC: 32 g/dL (ref 30.0–36.0)
MCV: 94.9 fL (ref 78.0–100.0)
Monocytes Absolute: 0.5 10*3/uL (ref 0.1–1.0)
Monocytes Relative: 7 %
Neutro Abs: 4.2 10*3/uL (ref 1.7–7.7)
Neutrophils Relative %: 61 %
Platelets: 171 10*3/uL (ref 150–400)
RBC: 3.72 MIL/uL — AB (ref 3.87–5.11)
RDW: 13.6 % (ref 11.5–15.5)
WBC: 6.9 10*3/uL (ref 4.0–10.5)

## 2018-05-13 LAB — BASIC METABOLIC PANEL
ANION GAP: 8 (ref 5–15)
BUN: 22 mg/dL — ABNORMAL HIGH (ref 6–20)
CHLORIDE: 107 mmol/L (ref 101–111)
CO2: 26 mmol/L (ref 22–32)
Calcium: 8.9 mg/dL (ref 8.9–10.3)
Creatinine, Ser: 0.69 mg/dL (ref 0.44–1.00)
GFR calc Af Amer: >60 mL/min (ref >60)
GFR calc non Af Amer: >60 mL/min (ref >60)
Glucose, Bld: 117 mg/dL — ABNORMAL HIGH (ref 65–99)
POTASSIUM: 3.9 mmol/L (ref 3.5–5.1)
SODIUM: 141 mmol/L (ref 135–145)

## 2018-05-13 LAB — I-STAT TROPONIN, ED: TROPONIN I, POC: 0 ng/mL (ref 0.00–0.08)

## 2018-05-13 NOTE — ED Provider Notes (Signed)
[facility] EMERGENCY DEPARTMENT Provider Note   CSN: [id number] Arrival date & time: 05/12/18  2244     History   Chief Complaint Chief Complaint  Patient presents with  . Chest Pain  Level 5 caveat due to dementia  HPI Maureen Coleman is a 84 y.o. female.  The history is provided by the EMS personnel, the patient and the nursing home. The history is limited by the condition of the patient.  Chest Pain   This is a new problem. Episode onset: Unknown. Episode frequency: Unknown. Pertinent negatives include no cough and no shortness of breath.   Patient with history of dementia presents to nursing facility with chest pain.  Patient denies any complaints at this time.  She denies any active chest pain, shortness of breath, abdominal pain, vomiting  I called the nursing facility where she resides, Engineer, petroleum at that I spoke to was not present when patient had chest pain.  She was told in shift report the patient had chest pain but has no other details Past Medical History:  Diagnosis Date  . Atrial fibrillation (HCC)   . Dementia   . Depression   . Hyperlipidemia     There are no active problems to display for this patient.   History reviewed. No pertinent surgical history.   OB History   None      Home Medications    Prior to Admission medications   Medication Sig Start Date End Date Taking? Authorizing Provider  acetaminophen (TYLENOL) 500 MG tablet Take 500 mg by mouth 2 (two) times daily.    [provider]  aspirin EC 81 MG tablet Take 81 mg by mouth daily.    [provider]  atorvastatin (LIPITOR) 20 MG tablet Take 20 mg by mouth every evening.  06/06/16   [provider]  carboxymethylcellulose (REFRESH PLUS) 0.5 % SOLN Place 1 drop into the left eye 2 (two) times daily as needed (dry eye).    [provider]  diclofenac sodium (VOLTAREN) 1 % GEL Apply 1 application topically every evening.    [provider]  diltiazem (CARDIZEM CD) 180 MG 24 hr capsule Take 180 mg by mouth daily.  06/04/16   [provider]  donepezil (ARICEPT) 10 MG tablet Take 10 mg by mouth every morning.  05/29/16   [provider]  escitalopram (LEXAPRO) 10 MG tablet Take 10 mg by mouth daily.  05/22/16   [provider]  folic acid (FOLVITE) 1 MG tablet Take 800 mcg by mouth daily.    [provider]  Glucosamine-Chondroitin 250-200 MG CAPS Take 1 tablet by mouth daily.     [provider]  guaifenesin (HUMIBID E) 400 MG TABS tablet Take 400 mg by mouth every 12 (twelve) hours.    [provider]  Lidocaine HCl (ASPERCREME W/LIDOCAINE) 4 % CREA Apply 1 application topically every evening. Apply to right knee topically every day and evening shift for pain.    [provider]  LORazepam (ATIVAN) 0.5 MG tablet Take 0.5 mg by mouth every 12 (twelve) hours.    [provider]  Melatonin 3 MG TABS Take 3 mg by mouth at bedtime as needed (sleep).     [provider]  Multiple Vitamin (MULTIVITAMIN WITH MINERALS) TABS tablet Take 1 tablet by mouth daily.    [provider]  omeprazole (PRILOSEC) 20 MG capsule Take 20 mg by mouth every evening.     [provider]  OXYGEN Inhale 2 L into the lungs daily.    [provider]  POTASSIUM CHLORIDE PO Take 1 tablet by mouth daily. Give 1 tablet by mouth one time a day for supplement.    [provider]    Family History No family history on file.  Social History Social History   Tobacco Use  . Smoking status: Never Smoker  . Smokeless tobacco: Never Used  Substance Use Topics  . Alcohol use: Not on file  . Drug use: Not on file     Allergies   Adhesive [tape]; Augmentin [amoxicillin-pot clavulanate]; Cefdinir; Cortisone; Feldene [piroxicam]; Glucosamine forte [nutritional supplements]; Labetalol; Latex; Lotensin [benazepril hcl]; Other; Ultracet  [tramadol-acetaminophen]; and Vicodin [hydrocodone-acetaminophen]   Review of Systems Review of Systems  Unable to perform ROS: Dementia  Respiratory: Negative for cough and shortness of breath.   Cardiovascular: Positive for chest pain.     Physical Exam Updated Vital Signs BP 128/69   Pulse 61   Temp 98.1 F (36.7 C) (Oral)   Resp 13   SpO2 98%   Physical Exam  CONSTITUTIONAL: Elderly, no acute distress HEAD: Normocephalic/atraumatic EYES: EOMI/PERRL ENMT: Mucous membranes moist NECK: supple no meningeal signs SPINE/BACK:entire spine nontender CV: S1/S2 noted, no murmurs/rubs/gallops noted Chest-no bruising or tenderness LUNGS: Lungs are clear to auscultation bilaterally, no apparent distress ABDOMEN: soft, nontender, no rebound or guarding, bowel sounds noted throughout abdomen GU:no cva tenderness NEURO: Pt is awake/alert, moves all extremitiesx4.  No facial droop.  Patient appears pleasantly confused EXTREMITIES: pulses normal/equal, full ROM, no lower extremity  or tenderness SKIN: warm, color normal  ED Treatments / Results  Labs (all labs ordered are listed, but only abnormal results are displayed) Labs Reviewed  BASIC METABOLIC PANEL - Abnormal; Notable for the following components:      Result Value   Glucose, Bld 117 (*)    BUN 22 (*)    All other components within normal limits  CBC WITH DIFFERENTIAL/PLATELET - Abnormal; Notable for the following components:   RBC 3.72 (*)    Hemoglobin 11.3 (*)    HCT 35.3 (*)    All other components within normal limits  I-STAT TROPONIN, ED    EKG EKG Interpretation  Date/Time:  Wednesday May 12 2018 22:50:49 EDT Ventricular Rate:  70 PR Interval:    QRS Duration: 88 QT Interval:  392 QTC Calculation: 423 R Axis:   23 Text Interpretation:  Sinus rhythm Prolonged PR interval since last tracing no significant change Confirmed by Mancel Bale [phone]) on 05/13/2018 12:53:20 AM   Radiology Dg Chest 2  View  Result Date: 05/13/2018 CLINICAL DATA:  Mid chest pain prior to arrival. History of atrial fibrillation. EXAM: CHEST - 2 VIEW COMPARISON:  11/06/2017 FINDINGS: Stable cardiomegaly with tortuous atherosclerotic aorta. Linear scarring and atelectasis at the lung bases. Chronic lower thoracic compression fracture. High-riding humeral heads bilaterally. Remodeled appearance of the distal right clavicle noted which can be seen in chronic rotator cuff tears or rheumatoid arthritis. Bilateral osteoarthritic joint space narrowing spurring of the glenohumeral joints. IMPRESSION: Stable cardiomegaly with tortuous atherosclerotic aorta. Scarring and/or atelectasis at lung bases. Active pulmonary disease. Electronically Signed   By: Tollie Eth M.D.   On: 05/13/2018 01:07    Procedures Procedures   Medications Ordered in ED Medications - No data to display   Initial Impression / Assessment and Plan / ED Course  I have reviewed the triage vital signs and the nursing notes.  Pertinent labs & imaging results  that were available during my care of the patient were reviewed by me and considered in my medical decision making (see chart for details).     1:44 AM Patient denies any complaints at this time.  She is a very poor historian due to dementia.  She does not recall having any chest pain.  Nursing home was unable to provide any further details at this time. 1:49 AM Patient is been resting comfortably.  She does not have any chest pain at this time.  EKG, chest x-ray and labs are unrevealing.  At this point I feel she is safe for discharge back to facility Final Clinical Impressions(s) / ED Diagnoses   Final diagnoses:  Precordial pain    ED Discharge Orders    None       Zadie RhineWickline, Jaylee Lantry, MD 05/13/18 0150

## 2018-05-23 ENCOUNTER — Emergency Department (HOSPITAL_COMMUNITY): Payer: Medicare Other

## 2018-05-23 ENCOUNTER — Encounter (HOSPITAL_COMMUNITY): Payer: Self-pay | Admitting: Emergency Medicine

## 2018-05-23 ENCOUNTER — Emergency Department (HOSPITAL_COMMUNITY)
Admission: EM | Admit: 2018-05-23 | Discharge: 2018-05-23 | Disposition: A | Discharge status: Home / Self Care | Payer: Medicare Other | Attending: Emergency Medicine | Admitting: Emergency Medicine

## 2018-05-23 DIAGNOSIS — Y92121 Bathroom in nursing home as the place of occurrence of the external cause: Secondary | ICD-10-CM | POA: Diagnosis not present

## 2018-05-23 DIAGNOSIS — Z91040 Latex allergy status: Secondary | ICD-10-CM | POA: Insufficient documentation

## 2018-05-23 DIAGNOSIS — Z7982 Long term (current) use of aspirin: Secondary | ICD-10-CM | POA: Diagnosis not present

## 2018-05-23 DIAGNOSIS — Y998 Other external cause status: Secondary | ICD-10-CM | POA: Insufficient documentation

## 2018-05-23 DIAGNOSIS — S0990XA Unspecified injury of head, initial encounter: Secondary | ICD-10-CM

## 2018-05-23 DIAGNOSIS — S0003XA Contusion of scalp, initial encounter: Secondary | ICD-10-CM | POA: Insufficient documentation

## 2018-05-23 DIAGNOSIS — N390 Urinary tract infection, site not specified: Secondary | ICD-10-CM | POA: Diagnosis not present

## 2018-05-23 DIAGNOSIS — F0390 Unspecified dementia without behavioral disturbance: Secondary | ICD-10-CM | POA: Diagnosis not present

## 2018-05-23 DIAGNOSIS — W0110XA Fall on same level from slipping, tripping and stumbling with subsequent striking against unspecified object, initial encounter: Secondary | ICD-10-CM | POA: Diagnosis not present

## 2018-05-23 DIAGNOSIS — Z79899 Other long term (current) drug therapy: Secondary | ICD-10-CM | POA: Insufficient documentation

## 2018-05-23 DIAGNOSIS — F329 Major depressive disorder, single episode, unspecified: Secondary | ICD-10-CM | POA: Diagnosis not present

## 2018-05-23 DIAGNOSIS — Y9389 Activity, other specified: Secondary | ICD-10-CM | POA: Insufficient documentation

## 2018-05-23 DIAGNOSIS — W19XXXA Unspecified fall, initial encounter: Secondary | ICD-10-CM

## 2018-05-23 LAB — URINALYSIS, ROUTINE W REFLEX MICROSCOPIC
BILIRUBIN URINE: NEGATIVE
Glucose, UA: NEGATIVE mg/dL
Hgb urine dipstick: NEGATIVE
KETONES UR: NEGATIVE mg/dL
Nitrite: POSITIVE — AB
Protein, ur: NEGATIVE mg/dL
Specific Gravity, Urine: 1.011 (ref 1.005–1.030)
pH: 7 (ref 5.0–8.0)

## 2018-05-23 MED ORDER — SULFAMETHOXAZOLE-TRIMETHOPRIM 800-160 MG PO TABS: 1.0000 | ORAL_TABLET | Freq: Two times a day (BID) | ORAL | 0 refills | Status: AC

## 2018-05-23 NOTE — ED Notes (Signed)
Pt in CT.

## 2018-05-23 NOTE — ED Notes (Signed)
Pt ambulated to BR with assistance.

## 2018-05-23 NOTE — ED Provider Notes (Signed)
[facility] EMERGENCY DEPARTMENT Provider Note   CSN: [id number] Arrival date & time: 05/23/18  1142     History   Chief Complaint Chief Complaint  Patient presents with  . Fall    HPI Maureen Coleman is a 84 y.o. female.  The history is provided by the patient, the nursing home and a relative. The history is limited by the condition of the patient (Hx dementia).  Fall   Pt was seen at 1300.  Per NH and pt's family: Pt found sitting on the bathroom floor, presumed fall. Family states pt has hx of dementia and frequent falls. Family noted new bruising to forehead and posterior scalp. Pt currently denies complaints.   Past Medical History:  Diagnosis Date  . Atrial fibrillation (HCC)   . Dementia   . Depression   . Hyperlipidemia     There are no active problems to display for this patient.   History reviewed. No pertinent surgical history.   OB History   None      Home Medications    Prior to Admission medications   Medication Sig Start Date End Date Taking? Authorizing Provider  acetaminophen (TYLENOL) 500 MG tablet Take 500 mg by mouth 2 (two) times daily.   Yes [provider]  aspirin EC 81 MG tablet Take 81 mg by mouth daily.   Yes [provider]  atorvastatin (LIPITOR) 20 MG tablet Take 20 mg by mouth every evening.  06/06/16  Yes [provider]  carboxymethylcellulose (REFRESH PLUS) 0.5 % SOLN Place 1 drop into the left eye 2 (two) times daily as needed (dry eye).   Yes [provider]  diltiazem (CARDIZEM CD) 180 MG 24 hr capsule Take 180 mg by mouth daily.  06/04/16  Yes [provider]  escitalopram (LEXAPRO) 10 MG tablet Take 10 mg by mouth daily.  05/22/16  Yes [provider]  folic acid (FOLVITE) 1 MG tablet Take 800 mcg by mouth daily.   Yes [provider]  Glucosamine-Chondroitin 250-200 MG CAPS Take 1 tablet by mouth daily.    Yes [provider]  guaifenesin (HUMIBID E) 400 MG  TABS tablet Take 400 mg by mouth every 12 (twelve) hours.   Yes [provider]  Lidocaine HCl (ASPERCREME W/LIDOCAINE) 4 % CREA Apply 1 application topically every evening. Apply to right knee topically every day and evening shift for pain.   Yes [provider]  LORazepam (ATIVAN) 0.5 MG tablet Take 0.5 mg by mouth every 12 (twelve) hours.   Yes [provider]  Melatonin 5 MG TABS Take 1 tablet by mouth at bedtime.   Yes [provider]  Multiple Vitamin (MULTIVITAMIN WITH MINERALS) TABS tablet Take 1 tablet by mouth daily.   Yes [provider]  omeprazole (PRILOSEC) 20 MG capsule Take 20 mg by mouth every evening.    Yes [provider]  OXYGEN Inhale 2 L into the lungs daily.   Yes [provider]  potassium chloride (K-DUR,KLOR-CON) 10 MEQ tablet Take 10 mEq by mouth daily.  05/15/18  Yes [provider]  senna-docusate (SENOKOT-S) 8.6-50 MG tablet Take 1 tablet by mouth daily.   Yes [provider]  diclofenac sodium (VOLTAREN) 1 % GEL Apply 1 application topically every evening.    [provider]    Family History History reviewed. No pertinent family history.  Social History Social History   Tobacco Use  . Smoking status: Never Smoker  . Smokeless tobacco:  Never Used  Substance Use Topics  . Alcohol use: Never    Frequency: Never  . Drug use: Never     Allergies   Adhesive [tape]; Augmentin [amoxicillin-pot clavulanate]; Cefdinir; Cortisone; Feldene [piroxicam]; Glucosamine forte [nutritional supplements]; Labetalol; Latex; Lotensin [benazepril hcl]; Other; Ultracet [tramadol-acetaminophen]; and Vicodin [hydrocodone-acetaminophen]   Review of Systems Review of Systems  Unable to perform ROS: Dementia     Physical Exam Updated Vital Signs BP 136/71   Pulse 73   Temp 98.5 F (36.9 C) (Oral)   Resp 16   Ht 5\' 7"  (1.702 m)   Wt 72.6 kg (160 lb)   SpO2 98%   BMI 25.06 kg/m     Physical Exam 1305: Physical examination:  Nursing notes reviewed; Vital signs and O2 SAT reviewed;  Constitutional: Well developed, Well nourished, Well hydrated, In no acute distress; Head:  Normocephalic, +left forehead and post scalp hematomas.;; Eyes: EOMI, PERRL, No scleral icterus; ENMT: Mouth and pharynx normal, Mucous membranes moist; Neck: Supple, Trachea midline; Spine: No midline CS, TS, LS tenderness.; Cardiovascular: Regular rate and rhythm, No gallop; Respiratory: Breath sounds clear & equal bilaterally, No wheezes, Normal respiratory effort/excursion; Chest: Nontender, No deformity, Movement normal, No crepitus, No abrasions or ecchymosis.; Abdomen: Soft, Nontender, Nondistended, Normal bowel sounds, No abrasions or ecchymosis.; Genitourinary: No CVA tenderness;; Extremities: No deformity, Full range of motion major/large joints of bilat UE's and LE's without pain or tenderness to palp, Neurovascularly intact, Pulses normal, No tenderness, No edema, Pelvis stable; Neuro: Awake, alert, confused per hx dementia. No facial droop. Grips equal. Moves all extremities spontaneously and to command without apparent gross focal motor deficits.; Skin: Color normal, Warm, Dry    ED Treatments / Results  Labs (all labs ordered are listed, but only abnormal results are displayed)   EKG None  Radiology   Procedures Procedures (including critical care time)  Medications Ordered in ED Medications - No data to display   Initial Impression / Assessment and Plan / ED Course  I have reviewed the triage vital signs and the nursing notes.  Pertinent labs & imaging results that were available during my care of the patient were reviewed by me and considered in my medical decision making (see chart for details).  MDM Reviewed: previous chart, nursing note and vitals Interpretation: labs and CT scan   Results for orders placed or performed during the hospital encounter of 05/23/18   Urinalysis, Routine w reflex microscopic  Result Value Ref Range   Color, Urine YELLOW YELLOW   APPearance HAZY (A) CLEAR   Specific Gravity, Urine 1.011 1.005 - 1.030   pH 7.0 5.0 - 8.0   Glucose, UA NEGATIVE NEGATIVE mg/dL   Hgb urine dipstick NEGATIVE NEGATIVE   Bilirubin Urine NEGATIVE NEGATIVE   Ketones, ur NEGATIVE NEGATIVE mg/dL   Protein, ur NEGATIVE NEGATIVE mg/dL   Nitrite POSITIVE (A) NEGATIVE   Leukocytes, UA LARGE (A) NEGATIVE   RBC / HPF 6-10 0 - 5 RBC/hpf   WBC, UA 21-50 0 - 5 WBC/hpf   Bacteria, UA FEW (A) NONE SEEN   Squamous Epithelial / LPF 0-5 0 - 5   Budding Yeast PRESENT    Ct Head Wo Contrast Result Date: 05/23/2018 CLINICAL DATA:  Fall. EXAM: CT HEAD WITHOUT CONTRAST CT CERVICAL SPINE WITHOUT CONTRAST TECHNIQUE: Multidetector CT imaging of the head and cervical spine was performed following the standard protocol without intravenous contrast. Multiplanar CT image reconstructions of the cervical spine were also generated. COMPARISON:  January 22, 2018 FINDINGS: CT HEAD FINDINGS Brain: No subdural, epidural, or subarachnoid hemorrhage. Linear high attenuation of the left sylvian fissure on axial image 13 is stable since March of 2019, consistent with a vessel. The cerebellum, brainstem, and basal cisterns are normal. Ventricles and sulci are stable. Scattered white matter changes identified. No acute cortical ischemia or infarct is noted. No mass effect or midline shift. Vascular: Calcified atherosclerosis in the intracranial carotids. Skull: Normal. Negative for fracture or focal lesion. Sinuses/Orbits: No acute finding. Other: Hematomas are seen over the right posterior scalp in the left anterior scalp. Extracranial soft tissues are otherwise normal. CT CERVICAL SPINE FINDINGS Alignment: Normal. 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 No acute fracture. Soft tissues and spinal canal: No prevertebral fluid or swelling. No visible  canal hematoma. Disc levels:  Multilevel degenerative changes. Upper chest: Negative. Other: No other abnormalities. IMPRESSION: 1. No acute intracranial abnormalities identified. Stable scattered white matter changes. 2. Large hematomas in the anterior and posterior scalp as above. 3. No fracture or traumatic malalignment in the cervical spine. Electronically Signed   By: Gerome Sam III M.D   On: 05/23/2018 13:59   Ct Cervical Spine Wo Contrast Result Date: 05/23/2018 CLINICAL DATA:  Fall. EXAM: CT HEAD WITHOUT CONTRAST CT CERVICAL SPINE WITHOUT CONTRAST TECHNIQUE: Multidetector CT imaging of the head and cervical spine was performed following the standard protocol without intravenous contrast. Multiplanar CT image reconstructions of the cervical spine were also generated. COMPARISON:  January 22, 2018 FINDINGS: CT HEAD FINDINGS Brain: No subdural, epidural, or subarachnoid hemorrhage. Linear high attenuation of the left sylvian fissure on axial image 13 is stable since March of 2019, consistent with a vessel. The cerebellum, brainstem, and basal cisterns are normal. Ventricles and sulci are stable. Scattered white matter changes identified. No acute cortical ischemia or infarct is noted. No mass effect or midline shift. Vascular: Calcified atherosclerosis in the intracranial carotids. Skull: Normal. Negative for fracture or focal lesion. Sinuses/Orbits: No acute finding. Other: Hematomas are seen over the right posterior scalp in the left anterior scalp. Extracranial soft tissues are otherwise normal. CT CERVICAL SPINE FINDINGS Alignment: Normal. 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 No acute fracture. Soft tissues and spinal canal: No prevertebral fluid or swelling. No visible canal hematoma. Disc levels:  Multilevel degenerative changes. Upper chest: Negative. Other: No other abnormalities. IMPRESSION: 1. No acute intracranial abnormalities identified. Stable  scattered white matter changes. 2. Large hematomas in the anterior and posterior scalp as above. 3. No fracture or traumatic malalignment in the cervical spine. Electronically Signed   By: Gerome Sam III M.D   On: 05/23/2018 13:59    1500:  Pt has ambulated with steady gait, easy resps, NAD. Family at bedside states pt is acting per her baseline. Workup with UTI, otherwise reassuring. They would like pt to go back to NH now.  Dx and testing d/w pt and family.  Questions answered.  Verb understanding, agreeable to d/c back to NH with outpt f/u.   Final Clinical Impressions(s) / ED Diagnoses   Final diagnoses:  None    ED Discharge Orders    None       [name], DO 05/27/18 1436

## 2018-05-23 NOTE — ED Triage Notes (Signed)
Pt brought [place] [facility] [place] for assumed [place].  Pt was found sitting in the bathroom floor.  Hematoma to left frontal area and bilateral posterior area of head.  Pt is pleasantly confused at baseline.

## 2018-05-23 NOTE — Discharge Instructions (Signed)
Take over the counter tylenol, as directed on packaging, as needed for discomfort. Take the prescription as directed. Call your regular medical doctor tomorrow to schedule a follow up appointment within the next 3 days.  Return to the Emergency Department immediately sooner if worsening.

## 2018-05-25 LAB — URINE CULTURE: Culture: >=100000 — AB

## 2018-05-26 ENCOUNTER — Telehealth: Payer: Self-pay | Admitting: *Deleted

## 2018-05-26 NOTE — Progress Notes (Signed)
ED Antimicrobial Stewardship Positive Culture Follow Up   [name] is an 84 y.o. female who [facility] to [facility] with a chief complaint of  Chief Complaint  Patient presents with  . Fall    Recent Results (from the past 720 hour(s))  Urine culture     Status: Abnormal   Collection Time: 05/23/18 12:59 PM  Result Value Ref Range Status   Specimen Description   Final    URINE, CLEAN CATCH Performed at [facility], [address] [place], [place] [address]    Special Requests   Final    NONE Performed at [facility], [address] [place], [place] [address]    Culture >=100,000 COLONIES/mL ESCHERICHIA COLI (A)  Final   Report Status 05/25/2018 FINAL  Final   Organism ID, Bacteria ESCHERICHIA COLI (A)  Final      Susceptibility   Escherichia coli - MIC*    AMPICILLIN >=32 RESISTANT Resistant     CEFAZOLIN <=4 SENSITIVE Sensitive     CEFTRIAXONE <=1 SENSITIVE Sensitive     CIPROFLOXACIN <=0.25 SENSITIVE Sensitive     GENTAMICIN <=1 SENSITIVE Sensitive     IMIPENEM <=0.25 SENSITIVE Sensitive     NITROFURANTOIN <=16 SENSITIVE Sensitive     TRIMETH/SULFA >=320 RESISTANT Resistant     AMPICILLIN/SULBACTAM 16 INTERMEDIATE Intermediate     PIP/TAZO <=4 SENSITIVE Sensitive     Extended ESBL NEGATIVE Sensitive     * >=100,000 COLONIES/mL ESCHERICHIA COLI    [x]  Treated with bactrim, organism resistant to prescribed antimicrobial []  Patient discharged originally without antimicrobial agent and treatment is now indicated  New antibiotic prescription: DC bactrim, start macrobid 100mg  PO BID x 7 days  ED Provider: Shirlyn GoltzEmily Shrosbree, PA   Grayce Budden, Drake LeachRachel Lynn 05/26/2018, 9:19 AM Clinical Pharmacist Monday - Friday phone -  [phone] Saturday - Sunday phone - [phone]

## 2018-05-26 NOTE — Telephone Encounter (Signed)
Post ED Visit - Positive Culture Follow-up: Successful Patient Follow-Up  Culture assessed and recommendations reviewed by:  []  Enzo BiNathan Batchelder, Pharm.D. []  Celedonio MiyamotoJeremy Frens, Pharm.D., BCPS AQ-ID []  Garvin FilaMike Maccia, Pharm.D., BCPS []  Georgina PillionElizabeth Martin, [address]Ph[address]D., BCPS []  SweetserMinh Pham, [place]., BCPS, AAHIVP []  Estella HuskMichelle Turner, Pharm.D., BCPS, AAHIVP [x]  Lysle Pearlachel Rumbarger, PharmD, BCPS []  Phillips Climeshuy Dang, PharmD, BCPS []  Agapito GamesAlison Masters, PharmD, BCPS []  Verlan FriendsErin Deja, PharmD  Positive urine culture  []  Patient discharged without antimicrobial prescription and treatment is now indicated [x]  Organism is resistant to prescribed ED discharge antimicrobial []  Patient with positive blood cultures  Changes discussed with Shirlyn GoltzEmily Shrosbree, PA-C New antibiotic prescription Macrobid 100mg  PO BID x 7 days Faxed to [facility], Att Michelle Echols, [phone]  Contacted facility 05/26/2018 at 0930   Lysle PearlRobertson, Jiro Kiester Talley 05/26/2018, 9:31 AM

## 2018-07-03 ENCOUNTER — Emergency Department (HOSPITAL_COMMUNITY): Payer: Medicare Other

## 2018-07-03 ENCOUNTER — Other Ambulatory Visit: Payer: Self-pay

## 2018-07-03 ENCOUNTER — Encounter (HOSPITAL_COMMUNITY): Payer: Self-pay | Admitting: Emergency Medicine

## 2018-07-03 ENCOUNTER — Emergency Department (HOSPITAL_COMMUNITY)
Admission: EM | Admit: 2018-07-03 | Discharge: 2018-07-03 | Disposition: A | Discharge status: Home / Self Care | Payer: Medicare Other | Attending: Emergency Medicine | Admitting: Emergency Medicine

## 2018-07-03 DIAGNOSIS — Z7982 Long term (current) use of aspirin: Secondary | ICD-10-CM | POA: Insufficient documentation

## 2018-07-03 DIAGNOSIS — Y92121 Bathroom in nursing home as the place of occurrence of the external cause: Secondary | ICD-10-CM | POA: Insufficient documentation

## 2018-07-03 DIAGNOSIS — Z91040 Latex allergy status: Secondary | ICD-10-CM | POA: Insufficient documentation

## 2018-07-03 DIAGNOSIS — F0390 Unspecified dementia without behavioral disturbance: Secondary | ICD-10-CM | POA: Insufficient documentation

## 2018-07-03 DIAGNOSIS — W19XXXA Unspecified fall, initial encounter: Secondary | ICD-10-CM | POA: Insufficient documentation

## 2018-07-03 DIAGNOSIS — Y998 Other external cause status: Secondary | ICD-10-CM | POA: Diagnosis not present

## 2018-07-03 DIAGNOSIS — M179 Osteoarthritis of knee, unspecified: Secondary | ICD-10-CM | POA: Diagnosis not present

## 2018-07-03 DIAGNOSIS — S51011A Laceration without foreign body of right elbow, initial encounter: Secondary | ICD-10-CM

## 2018-07-03 DIAGNOSIS — Z79899 Other long term (current) drug therapy: Secondary | ICD-10-CM | POA: Insufficient documentation

## 2018-07-03 DIAGNOSIS — Y939 Activity, unspecified: Secondary | ICD-10-CM | POA: Diagnosis not present

## 2018-07-03 MED ORDER — BACITRACIN ZINC 500 UNIT/GM EX OINT: 1.0000 "application " | TOPICAL_OINTMENT | Freq: Two times a day (BID) | CUTANEOUS | Status: DC

## 2018-07-03 NOTE — ED Provider Notes (Addendum)
[facility] EMERGENCY DEPARTMENT Provider Note   CSN: [id number] Arrival date & time: 07/03/18  [id number]     History   Chief Complaint Chief Complaint  Patient presents with  . Fall    HPI Maureen Coleman is a 84 y.o. female.  HPI  The patient is an 84 year old female, she has a history of dementia, atrial fibrillation and [facility] lives at [facility].  Level 5 caveat applies secondary to dementia  The patient is a pleasant 84 year old female, she presents from her nursing facility after she had an unwitnessed fall in her bathroom.  It is unclear about the events leading up to this exactly however the patient was noted to have a skin tear on her right elbow and was complaining of a headache.  She did not have any other complaints but could not say exactly what happened.  There was a small amount of blood around her right elbow.  She was transported by paramedics without any events in route.  According to the paperwork accompanying the patient from her nursing facility she does not fact take diltiazem for atrial fibrillation as well as a baby aspirin daily.  She also takes Lexapro, Lipitor, Ativan as needed  According to the medical record the patient has been seen for multiple falls including December 14, December 22, January 3, February 26, March 1 and June 30 most recently.  Past Medical History:  Diagnosis Date  . Atrial fibrillation (HCC)   . Dementia   . Depression   . Hyperlipidemia     There are no active problems to display for this patient.   History reviewed. No pertinent surgical history.   OB History   None      Home Medications    Prior to Admission medications   Medication Sig Start Date End Date Taking? Authorizing Provider  acetaminophen (TYLENOL) 500 MG tablet Take 500 mg by mouth 2 (two) times daily.    [provider]  aspirin EC 81 MG tablet Take 81 mg by mouth daily.    [provider]  atorvastatin (LIPITOR) 20 MG  tablet Take 20 mg by mouth every evening.  06/06/16   [provider]  carboxymethylcellulose (REFRESH PLUS) 0.5 % SOLN Place 1 drop into the left eye 2 (two) times daily as needed (dry eye).    [provider]  diclofenac sodium (VOLTAREN) 1 % GEL Apply 1 application topically every evening.    [provider]  diltiazem (CARDIZEM CD) 180 MG 24 hr capsule Take 180 mg by mouth daily.  06/04/16   [provider]  escitalopram (LEXAPRO) 10 MG tablet Take 10 mg by mouth daily.  05/22/16   [provider]  folic acid (FOLVITE) 1 MG tablet Take 800 mcg by mouth daily.    [provider]  Glucosamine-Chondroitin 250-200 MG CAPS Take 1 tablet by mouth daily.     [provider]  guaifenesin (HUMIBID E) 400 MG TABS tablet Take 400 mg by mouth every 12 (twelve) hours.    [provider]  Lidocaine HCl (ASPERCREME W/LIDOCAINE) 4 % CREA Apply 1 application topically every evening. Apply to right knee topically every day and evening shift for pain.    [provider]  LORazepam (ATIVAN) 0.5 MG tablet Take 0.5 mg by mouth every 12 (twelve) hours.    [provider]  Melatonin 5 MG TABS Take 1 tablet by mouth at bedtime.    [provider]  Multiple Vitamin (MULTIVITAMIN WITH  MINERALS) TABS tablet Take 1 tablet by mouth daily.    [provider]  omeprazole (PRILOSEC) 20 MG capsule Take 20 mg by mouth every evening.     [provider]  OXYGEN Inhale 2 L into the lungs daily.    [provider]  potassium chloride (K-DUR,KLOR-CON) 10 MEQ tablet Take 10 mEq by mouth daily.  05/15/18   [provider]  senna-docusate (SENOKOT-S) 8.6-50 MG tablet Take 1 tablet by mouth daily.    [provider]  sulfamethoxazole-trimethoprim (BACTRIM DS) 800-160 MG tablet Take 1 tablet by mouth 2 (two) times daily. 05/23/18   [name], DO    Family History History reviewed. No  pertinent family history.  Social History Social History   Tobacco Use  . Smoking status: Never Smoker  . Smokeless tobacco: Never Used  Substance Use Topics  . Alcohol use: Never    Frequency: Never  . Drug use: Never     Allergies   Adhesive [tape]; Augmentin [amoxicillin-pot clavulanate]; Cefdinir; Cortisone; Feldene [piroxicam]; Glucosamine forte [nutritional supplements]; Labetalol; Latex; Lotensin [benazepril hcl]; Other; Ultracet [tramadol-acetaminophen]; and Vicodin [hydrocodone-acetaminophen]   Review of Systems Review of Systems  Unable to perform ROS: Dementia     Physical Exam Updated Vital Signs BP (!) 148/70 (BP Location: Right Arm)   Pulse 74   Temp 98.1 F (36.7 C) (Oral)   Resp 19   Ht 5\' 5"  (1.651 m)   Wt 72.6 kg   SpO2 97%   BMI 26.63 kg/m   Physical Exam  Constitutional: She appears well-developed and well-nourished. No distress.  HENT:  Head: Normocephalic and atraumatic.  Mouth/Throat: Oropharynx is clear and moist. No oropharyngeal exudate.  There is no palpable tenderness over the head over the scalp or the posterior cervical spine.  There is no malocclusion bruising or ecchymosis around the eyes or ears.  Eyes: Pupils are equal, round, and reactive to light. Conjunctivae and EOM are normal. Right eye exhibits no discharge. Left eye exhibits no discharge. No scleral icterus.  Neck: Normal range of motion. Neck supple. No JVD present. No thyromegaly present.  The neck is very supple with full range of motion and no posterior tenderness  Cardiovascular: Normal rate, normal heart sounds and intact distal pulses. Exam reveals no gallop and no friction rub.  No murmur heard. Rate controlled, slight irregularity, normal pulses at the radial arteries, no edema  Pulmonary/Chest: Effort normal and breath sounds normal. No respiratory distress. She has no wheezes. She has no rales.  Lung auscultation reveals no abnormal lung sounds, she has no pain  with breathing and no tenderness over the ribs bilaterally  Abdominal: Soft. Bowel sounds are normal. She exhibits no distension and no mass. There is no tenderness.  The abdomen is soft and very nontender without any guarding  Musculoskeletal: Normal range of motion. She exhibits no edema or tenderness.  There is decreased range of motion at the right shoulder, there is a prior surgical scar and an abnormally shaped shoulder however this may be old.  There is no bruising or skin breaks.  She has decreased range of motion of the right elbow with a small skin tear as well.  Her left upper extremity has no decreased range of motion or tenderness.  Her bilateral hips are supple, there is no leg length discrepancies, she does have some swelling of the right knee with some pain with range of motion.  Her bilateral ankles and feet appear normal.  There is no  tenderness over the thoracic or lumbar spines.  Lymphadenopathy:    She has no cervical adenopathy.  Neurological: She is alert. Coordination normal.  The patient is able to follow commands, open her mouth, move her eyes from side to side, she is able to lift all 4 extremities though when I asked her to lift her left leg she states I do not know which ones my left anymore.  When she actually lifts her legs she has normal strength.  There is no abnormal coordination, her speech seems to be clear.  She is confused about the date and the time but knows her name.  Skin: Skin is warm and dry. No rash noted. No erythema.  Small skin tear to the right elbow, less than 1 cm in length  Psychiatric: She has a normal mood and affect. Her behavior is normal.  Nursing note and vitals reviewed.    ED Treatments / Results  Labs (all labs ordered are listed, but only abnormal results are displayed) Labs Reviewed - No data to display  EKG None  Radiology Dg Shoulder Right  Result Date: 07/03/2018 CLINICAL DATA:  Status post fall, right shoulder pain EXAM:  RIGHT SHOULDER - 2+ VIEW COMPARISON:  None. FINDINGS: Generalized osteopenia. Subtle nondisplaced fracture of the right greater tuberosity. No other fracture or dislocation. Mild arthropathy of the acromioclavicular joint. IMPRESSION: 1. Subtle nondisplaced fracture of the right greater tuberosity. Electronically Signed   By: Elige Ko   On: 07/03/2018 08:38   Dg Elbow Complete Right (3+view)  Result Date: 07/03/2018 CLINICAL DATA:  Fall at [facility] home going to the bathroom, laceration posterior right elbow EXAM: RIGHT ELBOW - COMPLETE 3+ VIEW COMPARISON:  None. FINDINGS: There is no evidence of fracture, dislocation, or joint effusion. There is no evidence of arthropathy or other focal bone abnormality. Soft tissues are unremarkable. IMPRESSION: Negative. Electronically Signed   By: Corlis Leak M.D.   On: 07/03/2018 09:51   Ct Head Wo Contrast  Result Date: 07/03/2018 CLINICAL DATA:  Pain after fall. EXAM: CT HEAD WITHOUT CONTRAST CT CERVICAL SPINE WITHOUT CONTRAST TECHNIQUE: Multidetector CT imaging of the head and cervical spine was performed following the standard protocol without intravenous contrast. Multiplanar CT image reconstructions of the cervical spine were also generated. COMPARISON:  May 23, 2018 FINDINGS: CT HEAD FINDINGS Brain: No subdural, epidural, or subarachnoid hemorrhage. Cerebellum, brainstem, and basal cisterns are normal. Ventricles and sulci are unchanged. White matter changes are unremarkable. No acute cortical ischemia or infarct. No mass effect or midline shift. Vascular: No hyperdense vessel or unexpected calcification. Skull: Normal. Negative for fracture or focal lesion. Sinuses/Orbits: No acute finding. Other: None. CT CERVICAL SPINE FINDINGS Alignment: Evaluation of alignment is significantly limited due to motion. Skull base and vertebrae: Evaluation for fractures is significantly limited due to significant motion. No definitive fractures seen. Soft tissues and  spinal canal: No prevertebral fluid or swelling. No visible canal hematoma. Disc levels:  Degenerative changes at multiple levels. Upper chest: Negative. Other: No other abnormalities. IMPRESSION: 1. No acute intracranial abnormalities identified. 2. Evaluation of the cervical spine is markedly limited due to motion. Recommend repeating the cervical spine CT when the patient is better able to cooperate. Electronically Signed   By: Gerome Sam III M.D   On: 07/03/2018 08:37   Ct Cervical Spine Wo Contrast  Result Date: 07/03/2018 CLINICAL DATA:  Pain after fall. EXAM: CT HEAD WITHOUT CONTRAST CT CERVICAL SPINE WITHOUT CONTRAST TECHNIQUE: Multidetector CT imaging of the  head and cervical spine was performed following the standard protocol without intravenous contrast. Multiplanar CT image reconstructions of the cervical spine were also generated. COMPARISON:  May 23, 2018 FINDINGS: CT HEAD FINDINGS Brain: No subdural, epidural, or subarachnoid hemorrhage. Cerebellum, brainstem, and basal cisterns are normal. Ventricles and sulci are unchanged. White matter changes are unremarkable. No acute cortical ischemia or infarct. No mass effect or midline shift. Vascular: No hyperdense vessel or unexpected calcification. Skull: Normal. Negative for fracture or focal lesion. Sinuses/Orbits: No acute finding. Other: None. CT CERVICAL SPINE FINDINGS Alignment: Evaluation of alignment is significantly limited due to motion. Skull base and vertebrae: Evaluation for fractures is significantly limited due to significant motion. No definitive fractures seen. Soft tissues and spinal canal: No prevertebral fluid or swelling. No visible canal hematoma. Disc levels:  Degenerative changes at multiple levels. Upper chest: Negative. Other: No other abnormalities. IMPRESSION: 1. No acute intracranial abnormalities identified. 2. Evaluation of the cervical spine is markedly limited due to motion. Recommend repeating the cervical spine  CT when the patient is better able to cooperate. Electronically Signed   By: Gerome Sam III M.D   On: 07/03/2018 08:37   Dg Knee Complete 4 Views Right  Result Date: 07/03/2018 CLINICAL DATA:  Status post fall, right knee pain EXAM: RIGHT KNEE - COMPLETE 4+ VIEW COMPARISON:  None. FINDINGS: No acute fracture or dislocation. Generalized osteopenia. Severe medial femorotibial compartment joint space narrowing. Severe lateral femorotibial compartment joint space narrowing. Severe patellofemoral compartment joint space narrowing. Tricompartmental marginal osteophytes. Trace right joint effusion. IMPRESSION: 1.  No acute osseous injury of the right knee. 2. Severe tricompartmental osteoarthritis of the right knee. Electronically Signed   By: Elige Ko   On: 07/03/2018 08:36   Dg Hip Unilat W Or Wo Pelvis 2-3 Views Right  Result Date: 07/03/2018 CLINICAL DATA:  Status post fall. Right hip, knee and shoulder pain. EXAM: DG HIP (WITH OR WITHOUT PELVIS) 2-3V RIGHT COMPARISON:  None. FINDINGS: There is no evidence of hip fracture or dislocation. There is chondrocalcinosis of the right hip. There is generalized osteopenia. There is no evidence of arthropathy or other focal bone abnormality. IMPRESSION: No acute osseous injury of the right hip. Given the patient's age and osteopenia, if there is persistent clinical concern for an occult hip fracture, a MRI of the hip is recommended for increased sensitivity. Electronically Signed   By: Elige Ko   On: 07/03/2018 08:35    Procedures Procedures (including critical care time)  Medications Ordered in ED Medications  bacitracin ointment 1 application (has no administration in time range)     Initial Impression / Assessment and Plan / ED Course  I have reviewed the triage vital signs and the nursing notes.  Pertinent labs & imaging results that were available during my care of the patient were reviewed by me and considered in my medical decision  making (see chart for details).  Clinical Course as of Jul 03 1000  Sat Jul 03, 2018  8295 I have personally looked at the patient's x-rays, I see no signs of fracture of the pelvis, the hip, the knee, the shoulder, the brain or the cervical spine.  That being said she does have severe tricompartmental arthritis of her right knee.   [BM]  [phone] Per the radiology read there does appear to be a subtle nondisplaced fracture of the greater tuberosity of the shoulder.  She will be placed in a sling.  Otherwise she is stable to follow-up with  her family doctor.   [BM]  1000 Elbow neg, Family at bedside, updated Stable for d/c   [BM]    Clinical Course User Index [BM] Eber Hong, MD    Though it is not clear exactly what caused her fall it is clear from the medical record that she does have frequent falls and this may be related to O overall her balance.  Her dementia limits her history.  That being said she does appear to have several abnormal findings on exam including elbow and knee pain as well as complaining about a headache.  She is able to follow all of my commands on the neurologic exam however her mental status seems to be baseline with her dementia not knowing what time it is or what day it is but she is aware of her name.  We will pursue imaging of the affected joints above.  X-rays negative other than shoulder which has a greater tuberosity fracture, sling placed, wound care provided for the right elbow, no fracture seen  Final Clinical Impressions(s) / ED Diagnoses   Final diagnoses:  Fall, initial encounter  Skin tear of right elbow without complication, initial encounter      Eber Hong, MD 07/03/18 1324    Eber Hong, MD 07/03/18 1001

## 2018-07-03 NOTE — ED Triage Notes (Addendum)
Per [facility] called out reference to fall, pt was found sitting in the bathroom floor against the wall, pt states she fell and c/o pain to head at temples that feels like it radiates to the top of her head, bilateral shoulder pain and knee/leg pain (right worse), pt has skin tear to right elbow, hx of demenia

## 2018-07-03 NOTE — Discharge Instructions (Signed)
Your x-ray showed no signs of broken bones except for a small fracture of your shoulder.  This is nonsurgical, it will not need any other procedures however you will need to wear a sling for the next month or so.  You may follow-up with your family doctor within several days for a recheck.  You should also see the orthopedic surgeon in follow-up.  If you want to see a local surgeon, Dr. Romeo AppleHarrison can be reached at the phone number above.  Please make an appointment to be seen within the next couple of weeks.  This is not emergent and does not need surgery.  Please apply ice to the shoulder, Tylenol or ibuprofen for pain.

## 2018-07-03 NOTE — ED Provider Notes (Signed)
MSE was initiated and I personally evaluated the patient and placed orders (if any) at  6:30 AM on July 03, 2018.  The patient appears stable so that the remainder of the MSE may be completed by another provider.  Patient has dementia, she presents via EMS who reports she had a fall.  Patient states she did not know she fell.  She does complain of pain in her right hip and had complained of headache to the EMS.  Patient is awake and alert, she is moving her right leg although she states it hurts in the hip area.  She is noted to have some skin tears.  Initial x-rays and CT studies were ordered to get her evaluation started.  She was turned over to the next shift to finish her evaluation.  Devoria AlbeIva Karlissa Aron, MD, [facility]oFACEP    Arin Vanosdol, MD 07/03/18 [phone]

## 2019-06-29 IMAGING — CT CT HEAD W/O CM
4 of 7 series · 15 of 47 positions shown, 16 images · non-contrast
Comparison: 09/05/2017

CLINICAL DATA: Patient fell hitting head. Ataxia and dizziness
since falling the previous day.

EXAM:
CT HEAD WITHOUT CONTRAST
CT CERVICAL SPINE WITHOUT CONTRAST
TECHNIQUE: Multidetector CT imaging of the head and cervical spine was
performed following the standard protocol without intravenous
contrast. Multiplanar CT image reconstructions of the cervical spine
were also generated.

[Series 3: head wo · axial · 0.44mm/px · z∈[+1632,+1702]mm · 3 of 29 slices shown, 4 images]
[im 8/29  brain]
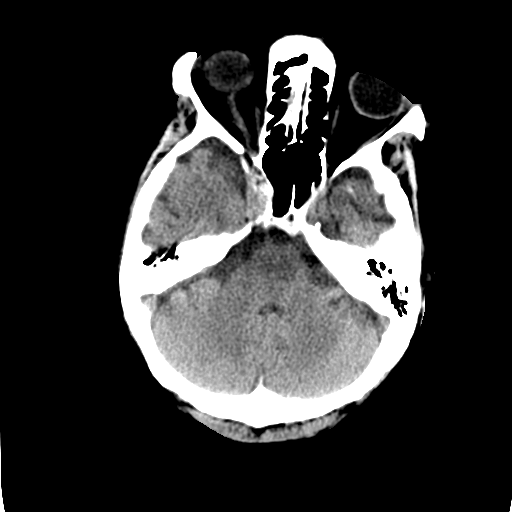
[im 8/29  bone]
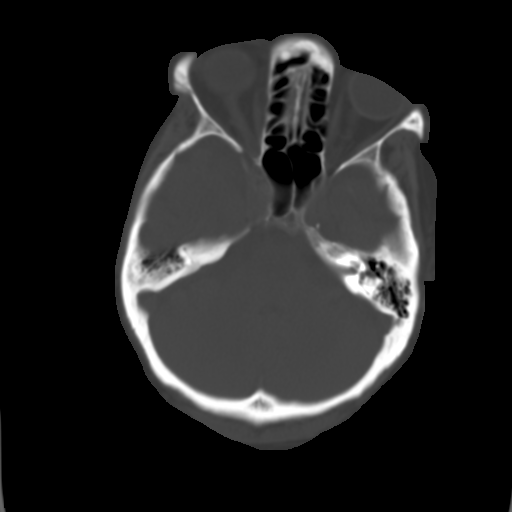
[im 15/29  brain]
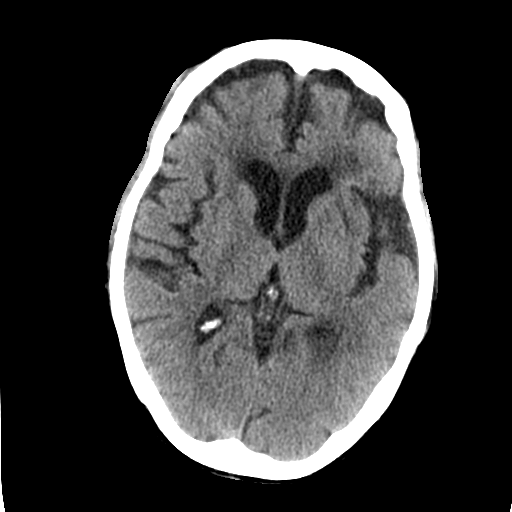
[im 22/29  brain]
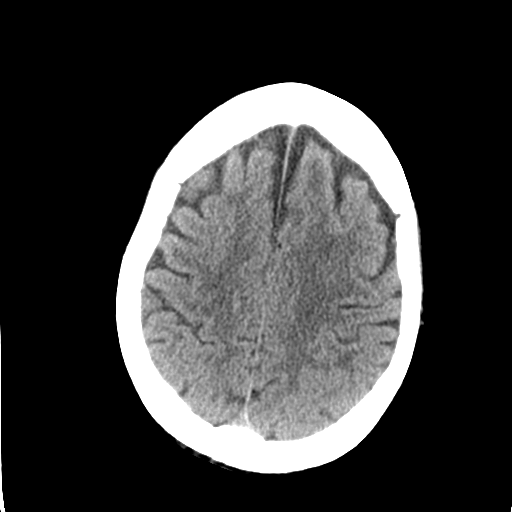

[Series 6: sagittal soft tissue · sagittal · 0.33mm/px · 1 of 58 slices shown]
[im 29/58  brain]
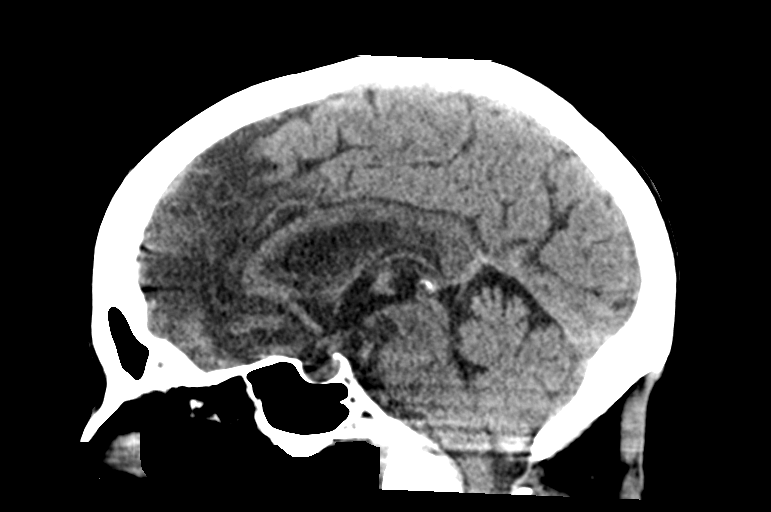

[Series 10: coronal bone · coronal · 0.27mm/px · 3 of 51 slices shown]
[im 15/51  brain]
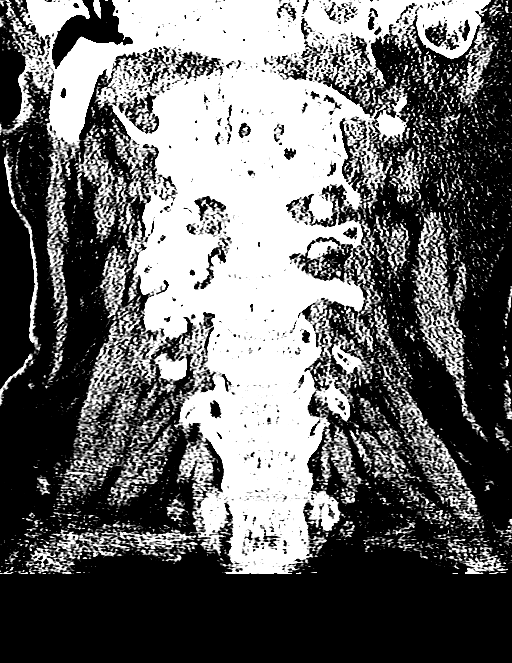
[im 22/51  brain]
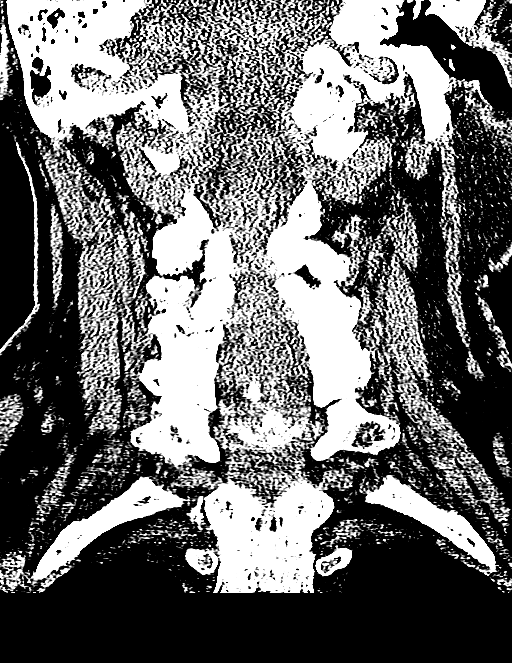
[im 29/51  brain]
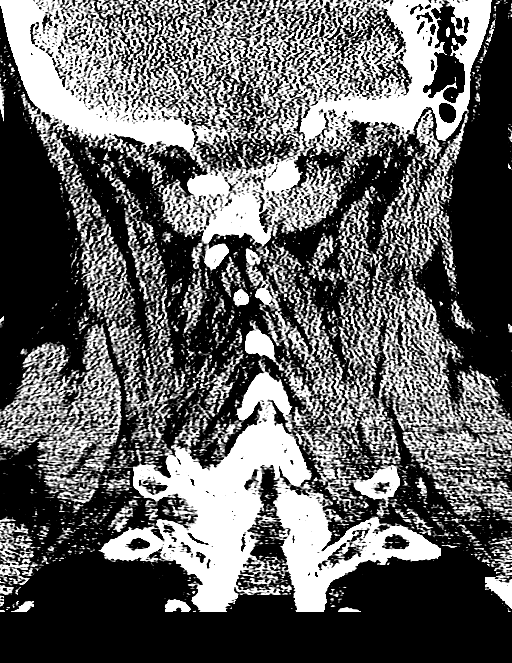

[Series 11: orthogonal bone · axial · 0.21mm/px · z∈[+1466,+1584]mm · 8 of 76 slices shown]
[im 7/76  bone]
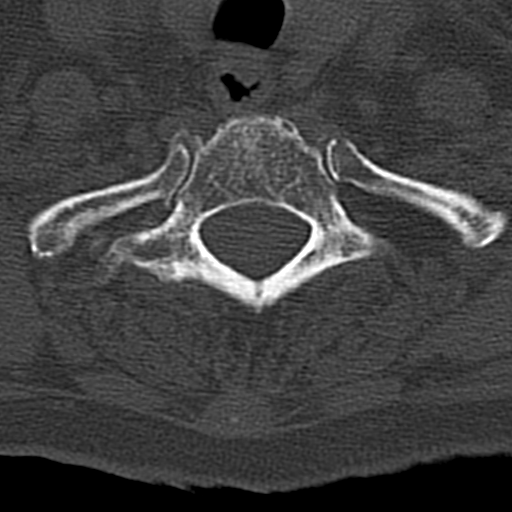
[im 14/76  bone]
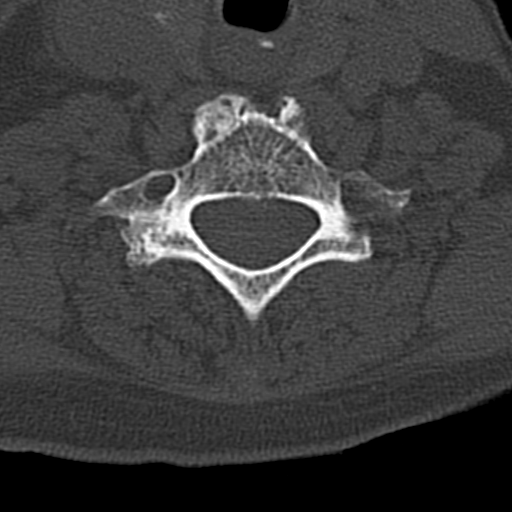
[im 28/76  bone]
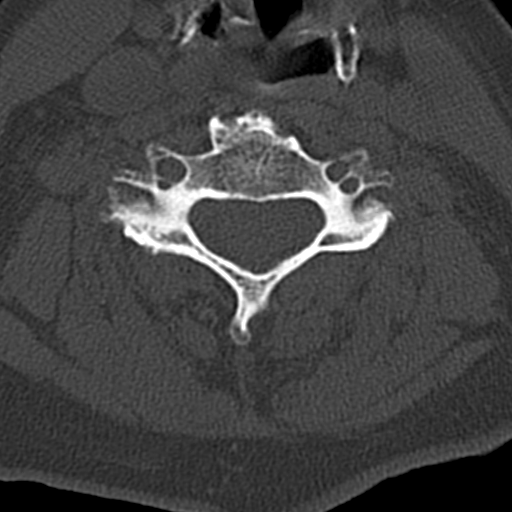
[im 35/76  bone]
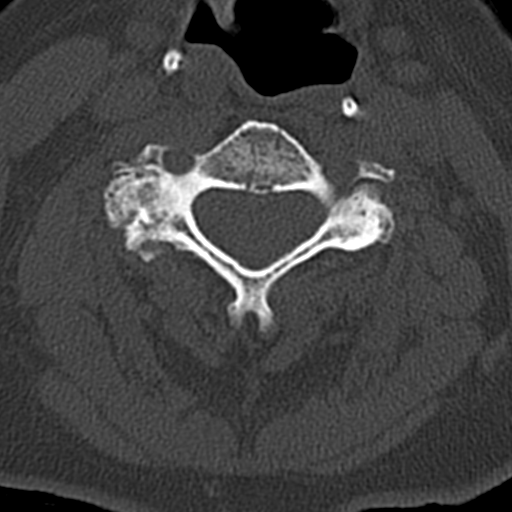
[im 41/76  bone]
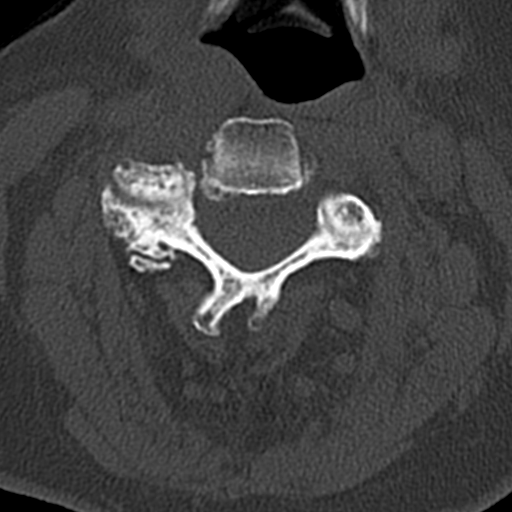
[im 48/76  bone]
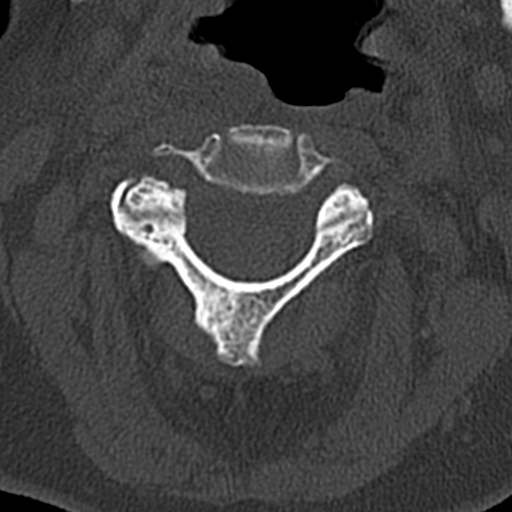
[im 62/76  bone]
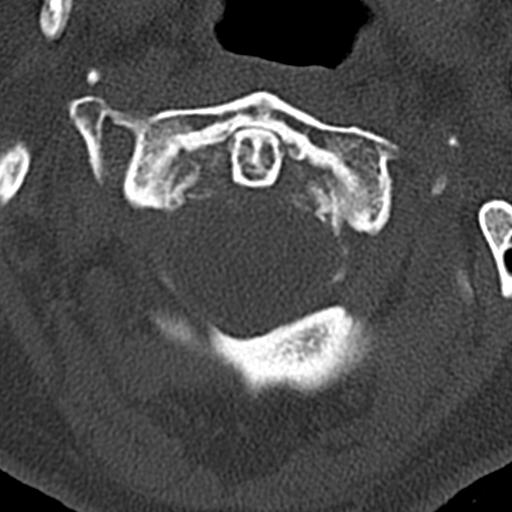
[im 69/76  bone]
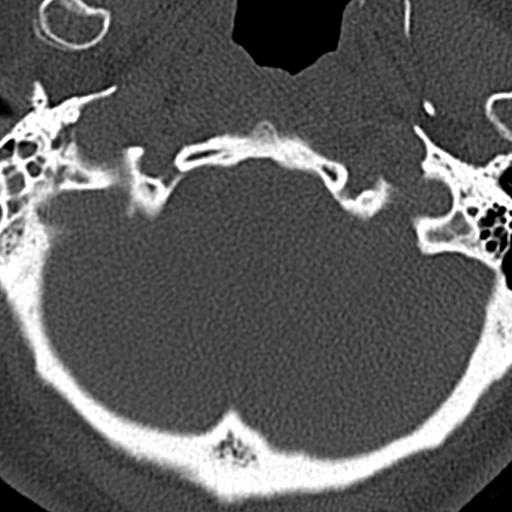

[15 of 47 positions shown; findings below may reference images not displayed]

FINDINGS: CT HEAD FINDINGS

BRAIN: There is sulcal and ventricular prominence consistent with
superficial and central atrophy. No intraparenchymal hemorrhage,
mass effect nor midline shift. Periventricular and subcortical white
matter hypodensities consistent with chronic small vessel ischemic
disease are identified. No acute large vascular territory infarcts.
No abnormal extra-axial fluid collections. Basal cisterns are not
effaced and midline.

VASCULAR: Moderate calcific atherosclerosis of the carotid siphons.

SKULL: No skull fracture.  Right frontal scalp contusion

SINUSES/ORBITS: The mastoid air-cells are clear. The included
paranasal sinuses are well-aerated.The included ocular globes and
orbital contents are non-suspicious.

OTHER: Re- demonstration partial right mastoid effusion.

CT CERVICAL SPINE FINDINGS

Alignment: Maintained cervical lordosis.

Skull base and vertebrae: Incomplete posterior arch of C1. This is
likely developmental. No acute fracture nor suspicious osseous
lesions. Subchondral cystic change of the odontoid process likely
degenerative in etiology with joint space narrowing of the
atlantodental interval.

Soft tissues and spinal canal: No prevertebral fluid or swelling. No
visible canal hematoma.

Disc levels: Mild C5-6 and moderate C6-7 degenerative disc disease
with posterior marginal osteophytes. No significant central canal
stenosis. Multilevel degenerative facet arthropathy and hypertrophy
contribute to mild neural foraminal encroachment on the right at
C2-3, bilaterally at C3-4, and on the right at C4-5.

Upper chest: Negative.

Other: None
IMPRESSION: 1. Chronic cerebral atrophy without acute intracranial abnormality.
2. Right frontal scalp contusion.
3. Partial right mastoid effusion.
4. Cervical spondylosis without acute cervical spine fracture.
5. Developmental incomplete posterior arch of C1.

## 2020-08-26 ENCOUNTER — Encounter (HOSPITAL_COMMUNITY): Payer: Self-pay

## 2020-08-26 ENCOUNTER — Emergency Department (HOSPITAL_COMMUNITY)
Admission: EM | Admit: 2020-08-26 | Discharge: 2020-08-26 | Disposition: A | Discharge status: Skilled Nursing Facility | Payer: Medicare Other | Attending: Emergency Medicine | Admitting: Emergency Medicine

## 2020-08-26 ENCOUNTER — Other Ambulatory Visit: Payer: Self-pay

## 2020-08-26 ENCOUNTER — Emergency Department (HOSPITAL_COMMUNITY): Payer: Medicare Other

## 2020-08-26 DIAGNOSIS — Z7982 Long term (current) use of aspirin: Secondary | ICD-10-CM | POA: Diagnosis not present

## 2020-08-26 DIAGNOSIS — Z79899 Other long term (current) drug therapy: Secondary | ICD-10-CM | POA: Insufficient documentation

## 2020-08-26 DIAGNOSIS — S62647A Nondisplaced fracture of proximal phalanx of left little finger, initial encounter for closed fracture: Secondary | ICD-10-CM

## 2020-08-26 DIAGNOSIS — E119 Type 2 diabetes mellitus without complications: Secondary | ICD-10-CM | POA: Diagnosis not present

## 2020-08-26 DIAGNOSIS — F0390 Unspecified dementia without behavioral disturbance: Secondary | ICD-10-CM | POA: Insufficient documentation

## 2020-08-26 DIAGNOSIS — S0181XA Laceration without foreign body of other part of head, initial encounter: Secondary | ICD-10-CM

## 2020-08-26 DIAGNOSIS — Z91040 Latex allergy status: Secondary | ICD-10-CM | POA: Insufficient documentation

## 2020-08-26 DIAGNOSIS — Y9301 Activity, walking, marching and hiking: Secondary | ICD-10-CM | POA: Diagnosis not present

## 2020-08-26 DIAGNOSIS — S0990XA Unspecified injury of head, initial encounter: Secondary | ICD-10-CM | POA: Diagnosis present

## 2020-08-26 DIAGNOSIS — W010XXA Fall on same level from slipping, tripping and stumbling without subsequent striking against object, initial encounter: Secondary | ICD-10-CM | POA: Diagnosis not present

## 2020-08-26 DIAGNOSIS — Z23 Encounter for immunization: Secondary | ICD-10-CM | POA: Insufficient documentation

## 2020-08-26 DIAGNOSIS — W19XXXA Unspecified fall, initial encounter: Secondary | ICD-10-CM

## 2020-08-26 HISTORY — DX: Gastro-esophageal reflux disease without esophagitis: K21.9

## 2020-08-26 HISTORY — DX: Type 2 diabetes mellitus without complications: E11.9

## 2020-08-26 MED ORDER — TETANUS-DIPHTH-ACELL PERTUSSIS 5-2.5-18.5 LF-MCG/0.5 IM SUSP
0.5000 mL | Freq: Once | INTRAMUSCULAR | Status: AC
  Administered 2020-08-26: 0.5 mL via INTRAMUSCULAR
  Filled 2020-08-26: qty 0.5

## 2020-08-26 MED ORDER — LIDOCAINE-EPINEPHRINE (PF) 1 %-1:200000 IJ SOLN
20.0000 mL | Freq: Once | INTRAMUSCULAR | Status: AC
  Administered 2020-08-26: 20 mL via INTRADERMAL
  Filled 2020-08-26: qty 30

## 2020-08-26 MED ORDER — SODIUM CHLORIDE 0.9 % IV BOLUS: 500.0000 mL | Freq: Once | INTRAVENOUS | Status: DC

## 2020-08-26 NOTE — ED Notes (Signed)
Pt. Ambulated, pt reported dizziness and needed to sit. PA made aware. Vital signs stable before ambulation.

## 2020-08-26 NOTE — ED Notes (Signed)
ED secretary aware pt needs transportation back to [facility].

## 2020-08-26 NOTE — ED Notes (Signed)
Pt. Ambulated independently down hall. Pt denies dizziness. PA aware and reported would discharge.

## 2020-08-26 NOTE — ED Notes (Signed)
Pt in radiology 

## 2020-08-26 NOTE — ED Notes (Signed)
Face cleaned with saline soaked gauze.

## 2020-08-26 NOTE — ED Triage Notes (Signed)
Pt brought by EMS from [facility] Pt was walking and slipped in tea. Per EMS pt was laying prone . Pt reports pain left shoulder. Pt has laceration over left brow, skintear under left eye, blood around lips and bruise under chin. Bleeding controlled and cervical collar in place

## 2020-08-26 NOTE — Discharge Instructions (Addendum)
The patient had imaging of the head, face and neck which did not show any fractures or any bleeding in the brain.    She did have a fracture to the left fifth finger.  She will be given information to follow-up with a hand doctor. The hand doctor will call the facility tomorrow to schedule an appointment for follow up.   She also had laceration repairs to her face and lip.  All of the sutures are absorbable and will not need to be removed.    Please monitor for any signs of infection if she does develop infection she will need to return to the emergency department or be seen in urgent care.   Maureen Coleman did have another fall before she left the emergency department when she tried to stand up out of the wheelchair.  She walked all the way down the hallway, slipped and fell into a glass door to the ground.  I examined the patient, there is no new signs of injury, she had no complaints, she was able to move all arms and legs without difficulty, there is no reinjury to her face and the sutures stayed in place.  Please apply ice as needed, Tylenol or ibuprofen for pain, she can see her doctor as needed but the stitches do not need to come out, they will absorb by themselves over the next couple of weeks

## 2020-08-26 NOTE — ED Provider Notes (Signed)
This patient is an 86 year old female who presents after having a fall, she has bruising to the left side of her periorbital region both above and below the eyebrow and the orbit with lacerations to each of those locations as well as a contusion to the left chin.  She is able to open and close her mouth with some tenderness.  She has bruising over the dorsum of the left hand.  She is able to move all 4 extremities with normal range of motion of the major joints and able to grip bilaterally with only minimal pain to the left hand despite the bruising.  Her mental status is awake alert and able to follow commands, she does not have any memory of what occurred, reports from EMS is that the patient has some dementia.  She is answering questions appropriately regarding where she hurts but cannot remember events of the day.  Imaging to rule out significant injuries as there is report of prior subdural hematoma  Prior to the patient leaving the emergency department the patient tried to get up out of her wheelchair and walk, she made it all the way down the hallway before she stumbled on the floor and fell into a door and slid to the ground.  On my examination the patient has no other injuries, head to toe exam reveals no new signs of trauma, mental status is normal, no indication for further imaging.  Medical screening examination/treatment/procedure(s) were conducted as a shared visit with non-physician practitioner(s) and myself.  I personally evaluated the patient during the encounter.  Clinical Impression:   Final diagnoses:  None         Eber Hong, MD 08/28/20 1701

## 2020-08-26 NOTE — ED Provider Notes (Addendum)
[facility] EMERGENCY DEPARTMENT Provider Note   CSN: [id number] Arrival date & time: 08/26/20  1331     History Chief Complaint  Patient presents with  . Fall  . Laceration    Maureen Coleman is a 86 y.o. female.  HPI   86 year old female with a history of atrial fibrillation, dementia, depression, diabetes, GERD, hyperlipidemia, who presents to the emergency department today for evaluation after a fall.  Per triage note and report received by EMS, patient was walking and slipped falling forward.  She had pain to the left shoulder on their assessment.  Level 5 caveat due to patient's history of dementia.  She is unable to tell me what happened prior to arrival.  Her only complaint during my evaluation is that her c-collar is uncomfortable.   3:10 PM Contacted staff at [facility]. Who state that the patient was getting up to get a cup of tea when she turned and slipped falling forward onto the ground.  She does not lose consciousness and was talking throughout the entire time.  She is not anticoagulated.  They states she has a history of dementia and is normally confused at baseline.  Past Medical History:  Diagnosis Date  . Atrial fibrillation (HCC)   . Dementia (HCC)   . Depression   . Diabetes mellitus without complication (HCC)   . GERD (gastroesophageal reflux disease)   . Hyperlipidemia     There are no problems to display for this patient.   History reviewed. No pertinent surgical history.   OB History   No obstetric history on file.     No family history on file.  Social History   Tobacco Use  . Smoking status: Never Smoker  . Smokeless tobacco: Never Used  Substance Use Topics  . Alcohol use: Never  . Drug use: Never    Home Medications Prior to Admission medications   Medication Sig Start Date End Date Taking? Authorizing Provider  acetaminophen (TYLENOL) 500 MG tablet Take 500 mg by mouth 2 (two) times daily.    [provider]    aspirin EC 81 MG tablet Take 81 mg by mouth daily.    [provider]  atorvastatin (LIPITOR) 20 MG tablet Take 20 mg by mouth every evening.  06/06/16   [provider]  carboxymethylcellulose (REFRESH PLUS) 0.5 % SOLN Place 1 drop into the left eye 2 (two) times daily as needed (dry eye).    [provider]  diclofenac sodium (VOLTAREN) 1 % GEL Apply 1 application topically every evening.    [provider]  diltiazem (CARDIZEM CD) 180 MG 24 hr capsule Take 180 mg by mouth daily.  06/04/16   [provider]  escitalopram (LEXAPRO) 10 MG tablet Take 10 mg by mouth daily.  05/22/16   [provider]  folic acid (FOLVITE) 1 MG tablet Take 800 mcg by mouth daily.    [provider]  Glucosamine-Chondroitin 250-200 MG CAPS Take 1 tablet by mouth daily.     [provider]  guaifenesin (HUMIBID E) 400 MG TABS tablet Take 400 mg by mouth every 12 (twelve) hours.    [provider]  Lidocaine HCl (ASPERCREME W/LIDOCAINE) 4 % CREA Apply 1 application topically every evening. Apply to right knee topically every day and evening shift for pain.    [provider]  LORazepam (ATIVAN) 0.5 MG tablet Take 0.5 mg by mouth every 12 (twelve) hours.    [provider]  Melatonin 5 MG TABS Take 1 tablet by mouth at bedtime.    [provider]  Multiple Vitamin (MULTIVITAMIN WITH MINERALS) TABS tablet Take 1 tablet by mouth daily.    [provider]  omeprazole (PRILOSEC) 20 MG capsule Take 20 mg by mouth every evening.     [provider]  OXYGEN Inhale 2 L into the lungs daily.    [provider]  potassium chloride (K-DUR,KLOR-CON) 10 MEQ tablet Take 10 mEq by mouth daily.  05/15/18   [provider]  senna-docusate (SENOKOT-S) 8.6-50 MG tablet Take 1 tablet by mouth daily.    [provider]  sulfamethoxazole-trimethoprim (BACTRIM DS) 800-160 MG tablet Take 1 tablet  by mouth 2 (two) times daily. 05/23/18   [name], DO    Allergies    Adhesive [tape], Augmentin [amoxicillin-pot clavulanate], Cefdinir, Cortisone, Feldene [piroxicam], Glucosamine forte [nutritional supplements], Labetalol, Latex, Lotensin [benazepril hcl], Other, Ultracet [tramadol-acetaminophen], and Vicodin [hydrocodone-acetaminophen]  Review of Systems   Review of Systems  Unable to perform ROS: Dementia  Skin: Positive for wound.  Neurological:       Head injury, no LOC    Physical Exam Updated Vital Signs BP 138/79 (BP Location: Right Arm)   Pulse 76   Temp 98.2 F (36.8 C) (Oral)   Resp 18   Ht  (1.727 m)   Wt 72.6 kg   SpO2 99%   BMI 24.34 kg/m   Physical Exam Vitals and nursing note reviewed.  Constitutional:      General: She is not in acute distress.    Appearance: She is well-developed.  HENT:     Head: Normocephalic.     Comments: Hematoma noted to the left upper, lower chin with laceration noted above and below the left eye. Eyes:     Extraocular Movements: Extraocular movements intact.     Conjunctiva/sclera: Conjunctivae normal.     Pupils: Pupils are equal, round, and reactive to light.  Cardiovascular:     Rate and Rhythm: Normal rate and regular rhythm.     Heart sounds: Normal heart sounds. No murmur heard.   Pulmonary:     Effort: Pulmonary effort is normal. No respiratory distress.     Breath sounds: Normal breath sounds. No wheezing or rales.  Abdominal:     General: Bowel sounds are normal.     Palpations: Abdomen is soft.     Tenderness: There is no abdominal tenderness. There is no guarding or rebound.  Musculoskeletal:     Cervical back: Neck supple.     Comments: Mild TTP noted to the bilateral hips, mid thoracic spine and lumbar spine.  No cervical spine tenderness.  C-collar in place.  TTP and ecchymosis noted to the left hand.  TTP noted to the bilateral knees with ecchymosis noted to the right knee.  Skin:     General: Skin is warm and dry.  Neurological:     Mental Status: She is alert.     Comments: Mental Status:  Alert, demented, disoriented to place, time, situation.  Able to follow simple commands.  Clear speech.   Cranial Nerves:  II:  pupils equal, round, reactive to light III,IV, VI: ptosis not present, extra-ocular motions intact bilaterally  V,VII: smile symmetric, facial light touch sensation equal VIII: hearing grossly normal to voice  X: uvula elevates symmetrically  XI: bilateral shoulder shrug symmetric and strong XII: midline tongue extension without fassiculations Motor:  Normal tone. 5/5 strength of BUE and BLE major  muscle groups including strong and equal grip strength and dorsiflexion/plantar flexion Sensory: light touch normal in all extremities.      ED Results / Procedures / Treatments   Labs (all labs ordered are listed, but only abnormal results are displayed) Labs Reviewed - No data to display  EKG None  Radiology DG Thoracic Spine 2 View  Result Date: 08/26/2020 CLINICAL DATA:  Fall. EXAM: THORACIC SPINE 2 VIEWS COMPARISON:  November 06, 2017. FINDINGS: Severe old T12 compression fracture is noted. Old T10 compression fracture is noted as well. No acute fracture or spondylolisthesis is noted. IMPRESSION: Severe old T12 compression fracture. Old T10 compression fracture. No acute abnormality seen in the thoracic spine. Electronically Signed   By: Lupita RaiderJames  Green Jr M.D.   On: 08/26/2020 16:17   CT Head Wo Contrast  Result Date: 08/26/2020 CLINICAL DATA:  Fall, laceration over left brow, left cheek EXAM: CT HEAD WITHOUT CONTRAST CT MAXILLOFACIAL WITHOUT CONTRAST TECHNIQUE: Multidetector CT imaging of the head and maxillofacial structures were performed using the standard protocol without intravenous contrast. Multiplanar CT image reconstructions of the maxillofacial structures were also generated. COMPARISON:  12/26/2019 FINDINGS: CT HEAD FINDINGS Brain: No  evidence of acute infarction, hemorrhage, hydrocephalus, extra-axial collection or mass lesion/mass effect. Periventricular and deep white matter hypodensity. Vascular: No hyperdense vessel or unexpected calcification. Skull: Normal. Negative for fracture or focal lesion. Other: None. CT MAXILLOFACIAL FINDINGS Examination of the facial bones is significantly limited by motion artifact. Osseous: No fracture or mandibular dislocation. No destructive process. Orbits: Negative. No traumatic or inflammatory finding. Sinuses: Clear. Soft tissues: Soft tissue laceration over the left brow and left cheek. IMPRESSION: 1. No acute intracranial pathology. Small-vessel white matter disease. 2. Examination of the facial bones is significantly limited by motion artifact. Within this limitation, no displaced fracture or dislocation of the facial bones. 3. Soft tissue laceration over the left brow and left cheek. Electronically Signed   By: Lauralyn PrimesAlex  Bibbey M.D.   On: 08/26/2020 15:43   CT Cervical Spine Wo Contrast  Result Date: 08/26/2020 CLINICAL DATA:  Neck trauma.  Intoxicated or obtunded. EXAM: CT CERVICAL SPINE WITHOUT CONTRAST TECHNIQUE: Multidetector CT imaging of the cervical spine was performed without intravenous contrast. Multiplanar CT image reconstructions were also generated. COMPARISON:  None. FINDINGS: Alignment: Similar straightening of the normal cervical lordosis. Trace anterolisthesis of C3 on C4, favor degenerative. Skull base and vertebrae: No acute fracture. No primary bone lesion or focal pathologic process. Soft tissues and spinal canal: No prevertebral fluid or swelling. No visible canal hematoma. Disc levels: Similar multilevel degenerative change with bulky right greater than left facet arthropathy at multiple levels. Upper chest: Limited evaluation without abnormality. Other: None IMPRESSION: No evidence of acute fracture or malalignment. Electronically Signed   By: Feliberto HartsFrederick S Jones MD   On:  08/26/2020 15:44   DG Shoulder Left  Result Date: 08/26/2020 CLINICAL DATA:  Fall. EXAM: LEFT SHOULDER - 2+ VIEW COMPARISON:  None. FINDINGS: There is no evidence of fracture or dislocation. There is no evidence of arthropathy or other focal bone abnormality. Soft tissues are unremarkable. IMPRESSION: Negative. Electronically Signed   By: Lupita RaiderJames  Green Jr M.D.   On: 08/26/2020 16:24   DG Knee Complete 4 Views Left  Result Date: 08/26/2020 CLINICAL DATA:  Fall. EXAM: LEFT KNEE - COMPLETE 4+ VIEW COMPARISON:  None. FINDINGS: Status post left total knee arthroplasty. The femoral and tibial components appear to be well situated. No joint effusion is noted. No fracture or dislocation is  noted. IMPRESSION: Status post left total knee arthroplasty. No acute abnormality seen. Electronically Signed   By: Lupita Raider M.D.   On: 08/26/2020 16:23   DG Knee Complete 4 Views Right  Result Date: 08/26/2020 CLINICAL DATA:  Fall. EXAM: RIGHT KNEE - COMPLETE 4+ VIEW COMPARISON:  None. FINDINGS: No evidence of fracture, dislocation, or joint effusion. Severe narrowing and osteophyte formation is seen involving the medial, lateral and patellofemoral spaces. Soft tissues are unremarkable. IMPRESSION: Severe tricompartmental osteoarthritis. No acute abnormality seen in the right knee. Electronically Signed   By: Lupita Raider M.D.   On: 08/26/2020 16:22   DG Hand Complete Left  Result Date: 08/26/2020 CLINICAL DATA:  Left hand pain after fall. EXAM: LEFT HAND - COMPLETE 3+ VIEW COMPARISON:  None. FINDINGS: Nondisplaced fracture is seen involving the fifth proximal phalanx. Severe degenerative changes seen involving the first carpometacarpal joint as well as the fourth proximal interphalangeal joint. Degenerative changes are also seen involving the second, third and fourth distal interphalangeal joints. Narrowing of radiocarpal joint is noted as well. IMPRESSION: 1. Nondisplaced fifth proximal phalangeal fracture. 2.  Osteoarthritis as described above. Electronically Signed   By: Lupita Raider M.D.   On: 08/26/2020 16:19   DG Hips Bilat W or Wo Pelvis 3-4 Views  Result Date: 08/26/2020 CLINICAL DATA:  Fall. EXAM: DG HIP (WITH OR WITHOUT PELVIS) 3-4V BILAT COMPARISON:  None. FINDINGS: There is no evidence of hip fracture or dislocation. Mild degenerative changes are seen involving both hip joints. IMPRESSION: Mild degenerative joint disease of both hip joints. No fracture or dislocation is noted. Electronically Signed   By: Lupita Raider M.D.   On: 08/26/2020 16:20   CT Maxillofacial Wo Contrast  Result Date: 08/26/2020 CLINICAL DATA:  Fall, laceration over left brow, left cheek EXAM: CT HEAD WITHOUT CONTRAST CT MAXILLOFACIAL WITHOUT CONTRAST TECHNIQUE: Multidetector CT imaging of the head and maxillofacial structures were performed using the standard protocol without intravenous contrast. Multiplanar CT image reconstructions of the maxillofacial structures were also generated. COMPARISON:  12/26/2019 FINDINGS: CT HEAD FINDINGS Brain: No evidence of acute infarction, hemorrhage, hydrocephalus, extra-axial collection or mass lesion/mass effect. Periventricular and deep white matter hypodensity. Vascular: No hyperdense vessel or unexpected calcification. Skull: Normal. Negative for fracture or focal lesion. Other: None. CT MAXILLOFACIAL FINDINGS Examination of the facial bones is significantly limited by motion artifact. Osseous: No fracture or mandibular dislocation. No destructive process. Orbits: Negative. No traumatic or inflammatory finding. Sinuses: Clear. Soft tissues: Soft tissue laceration over the left brow and left cheek. IMPRESSION: 1. No acute intracranial pathology. Small-vessel white matter disease. 2. Examination of the facial bones is significantly limited by motion artifact. Within this limitation, no displaced fracture or dislocation of the facial bones. 3. Soft tissue laceration over the left brow and  left cheek. Electronically Signed   By: Lauralyn Primes M.D.   On: 08/26/2020 15:43    Procedures .Marland KitchenLaceration Repair  Date/Time: 08/27/2020 8:07 AM Performed by: Karrie Meres, PA-C Authorized by: Karrie Meres, PA-C   Consent:    Consent obtained:  Verbal   Consent given by:  Patient   Risks discussed:  Infection, pain and poor cosmetic result   Alternatives discussed:  No treatment Anesthesia (see MAR for exact dosages):    Anesthesia method:  Local infiltration   Local anesthetic:  Lidocaine 1% WITH epi Laceration details:    Location: forehead.   Length (cm):  3 Pre-procedure details:    Preparation:  Imaging obtained to evaluate for foreign bodies and patient was prepped and draped in usual sterile fashion Exploration:    Hemostasis achieved with:  Epinephrine and direct pressure   Wound exploration: wound explored through full range of motion and entire depth of wound probed and visualized     Contaminated: no   Treatment:    Area cleansed with:  Saline   Amount of cleaning:  Extensive   Irrigation solution:  Sterile saline   Irrigation method:  Pressure wash   Visualized foreign bodies/material removed: no   Skin repair:    Repair method:  Sutures   Suture size:  4-0   Suture material:  Chromic gut   Suture technique:  Simple interrupted   Number of sutures:  4 Approximation:    Approximation:  Close Post-procedure details:    Dressing:  Open (no dressing)   Patient tolerance of procedure:  Tolerated well, no immediate complications .Marland KitchenLaceration Repair  Date/Time: 08/27/2020 8:08 AM Performed by: Karrie Meres, PA-C Authorized by: Karrie Meres, PA-C   Consent:    Consent obtained:  Verbal   Consent given by:  Patient   Risks discussed:  Infection, pain and poor cosmetic result   Alternatives discussed:  No treatment Anesthesia (see MAR for exact dosages):    Anesthesia method:  Local infiltration   Local anesthetic:  Lidocaine 1% WITH  epi Laceration details:    Location:  Face   Length (cm):  3 Repair type:    Repair type:  Intermediate Pre-procedure details:    Preparation:  Patient was prepped and draped in usual sterile fashion and imaging obtained to evaluate for foreign bodies Exploration:    Hemostasis achieved with:  Epinephrine and direct pressure   Wound exploration: wound explored through full range of motion and entire depth of wound probed and visualized   Treatment:    Area cleansed with:  Saline   Amount of cleaning:  Extensive   Irrigation solution:  Sterile saline   Irrigation method:  Pressure wash   Visualized foreign bodies/material removed: no   Skin repair:    Repair method:  Sutures   Suture size:  5-0   Suture technique:  Simple interrupted (vicryl rapide)   Number of sutures:  5 Approximation:    Approximation:  Close Post-procedure details:    Dressing:  Open (no dressing)   Patient tolerance of procedure:  Tolerated well, no immediate complications .Marland KitchenLaceration Repair  Date/Time: 08/27/2020 8:09 AM Performed by: Karrie Meres, PA-C Authorized by: Karrie Meres, PA-C   Consent:    Consent obtained:  Verbal   Consent given by:  Patient   Risks discussed:  Infection and pain   Alternatives discussed:  No treatment Anesthesia (see MAR for exact dosages):    Anesthesia method:  Local infiltration Laceration details:    Location:  Lip   Lip location:  Lower interior lip   Length (cm):  0.5 Repair type:    Repair type:  Simple Pre-procedure details:    Preparation:  Patient was prepped and draped in usual sterile fashion and imaging obtained to evaluate for foreign bodies Exploration:    Hemostasis achieved with:  Epinephrine and direct pressure   Wound exploration: wound explored through full range of motion and entire depth of wound probed and visualized     Contaminated: no   Treatment:    Area cleansed with:  Saline   Amount of cleaning:  Standard   Irrigation  solution:  Sterile saline  Skin repair:    Repair method:  Sutures   Suture size:  4-0   Suture material:  Chromic gut   Suture technique:  Simple interrupted   Number of sutures:  1 Approximation:    Approximation:  Close Post-procedure details:    Dressing:  Open (no dressing)   Patient tolerance of procedure:  Tolerated well, no immediate complications   (including critical care time)  Medications Ordered in ED Medications  Tdap (BOOSTRIX) injection 0.5 mL (0.5 mLs Intramuscular Given 08/26/20 1600)  lidocaine-EPINEPHrine (XYLOCAINE-EPINEPHrine) 1 %-1:200000 (PF) injection 20 mL (20 mLs Intradermal Given by Other 08/26/20 1651)    ED Course  I have reviewed the triage vital signs and the nursing notes.  Pertinent labs & imaging results that were available during my care of the patient were reviewed by me and considered in my medical decision making (see chart for details).    MDM Rules/Calculators/A&P                          86 year old female presenting to the emergency department today from SNF for evaluation after mechanical fall.  Sustained head trauma and facial trauma.  Also with ecchymosis to the left hand, tenderness along the spine and pain to the bilateral hips and knees.  Reviewed/interpreted imaging CT head/maxillofacial/cervical spine -negative for acute traumatic injury X-ray bilateral hip and pelvis-negative for acute traumatic injury X-ray right knee -negative for acute traumatic injury X-ray left knee-negative for acute traumatic injury X-ray left hand -nondisplaced fracture of the proximal fifth phalanx  - Discussed with Dr. Janee Morn of hand surgery who recommends ulnar gutter. He will f/u with facility to schedule appt. X-ray left shoulder -negative for acute fracture or dislocation  Patient also sustained lacerations which were repaired in the emergency department.  Following this she was able to ambulate independently down the hall without difficulty.   Her Tdap was updated.  She will need to follow-up with hand surgery in regards to her broken finger.  She will also need to follow-up with her regular doctor in a week for reevaluation and return the emergency department for any new or worsening symptoms.   Following discharge, pt ambulated again down the hall without nursing assistance and fell. She was evaluated by Dr Hyacinth Meeker and cleared to go back to her facility.   Final Clinical Impression(s) / ED Diagnoses Final diagnoses:  Fall, initial encounter  Injury of head, initial encounter  Facial laceration, initial encounter  Closed nondisplaced fracture of proximal phalanx of left little finger, initial encounter    Rx / DC Orders ED Discharge Orders    None       Karrie Meres, PA-C 08/26/20 1842    Cydne Grahn S, PA-C 08/27/20 [id number]    Eber Hong, MD 08/28/20 1700

## 2020-08-26 NOTE — ED Notes (Signed)
[facility] [employer] called to setup transportation back to facility at this time.
